# Patient Record
Sex: Female | Born: 1940 | Race: Black or African American | Hispanic: No | Smoking: Never smoker
Health system: Southern US, Community
[De-identification: ages and names within clinical notes are randomized; demographics above are authoritative.]

## PROBLEM LIST (undated history)

## (undated) DIAGNOSIS — I10 Essential (primary) hypertension: Secondary | ICD-10-CM

## (undated) DIAGNOSIS — E785 Hyperlipidemia, unspecified: Secondary | ICD-10-CM

## (undated) HISTORY — DX: Essential (primary) hypertension: I10

## (undated) HISTORY — DX: Hyperlipidemia, unspecified: E78.5

---

## 2000-03-23 ENCOUNTER — Emergency Department (HOSPITAL_COMMUNITY): Admission: EM | Admit: 2000-03-23 | Discharge: 2000-03-23 | Payer: Self-pay | Admitting: *Deleted

## 2000-04-30 ENCOUNTER — Emergency Department (HOSPITAL_COMMUNITY): Admission: EM | Admit: 2000-04-30 | Discharge: 2000-04-30 | Payer: Self-pay | Admitting: Emergency Medicine

## 2000-09-22 ENCOUNTER — Encounter: Admission: RE | Admit: 2000-09-22 | Discharge: 2000-09-22 | Payer: Self-pay | Admitting: Sports Medicine

## 2000-10-01 ENCOUNTER — Encounter: Admission: RE | Admit: 2000-10-01 | Discharge: 2000-10-01 | Payer: Self-pay | Admitting: Pediatrics

## 2000-11-13 ENCOUNTER — Encounter: Admission: RE | Admit: 2000-11-13 | Discharge: 2000-11-13 | Payer: Self-pay | Admitting: Family Medicine

## 2000-11-28 ENCOUNTER — Encounter: Admission: RE | Admit: 2000-11-28 | Discharge: 2000-11-28 | Payer: Self-pay | Admitting: Family Medicine

## 2001-01-14 ENCOUNTER — Encounter: Admission: RE | Admit: 2001-01-14 | Discharge: 2001-01-14 | Payer: Self-pay | Admitting: Family Medicine

## 2001-04-27 ENCOUNTER — Encounter: Admission: RE | Admit: 2001-04-27 | Discharge: 2001-04-27 | Payer: Self-pay | Admitting: Family Medicine

## 2001-06-19 ENCOUNTER — Encounter: Admission: RE | Admit: 2001-06-19 | Discharge: 2001-06-19 | Payer: Self-pay | Admitting: Family Medicine

## 2001-06-29 ENCOUNTER — Encounter: Admission: RE | Admit: 2001-06-29 | Discharge: 2001-06-29 | Payer: Self-pay | Admitting: Family Medicine

## 2001-07-08 ENCOUNTER — Encounter: Admission: RE | Admit: 2001-07-08 | Discharge: 2001-07-08 | Payer: Self-pay | Admitting: Family Medicine

## 2001-09-01 ENCOUNTER — Encounter: Admission: RE | Admit: 2001-09-01 | Discharge: 2001-09-01 | Payer: Self-pay | Admitting: Family Medicine

## 2001-09-01 ENCOUNTER — Encounter: Payer: Self-pay | Admitting: *Deleted

## 2001-09-01 ENCOUNTER — Encounter: Admission: RE | Admit: 2001-09-01 | Discharge: 2001-09-01 | Payer: Self-pay | Admitting: *Deleted

## 2001-10-14 ENCOUNTER — Encounter: Admission: RE | Admit: 2001-10-14 | Discharge: 2001-10-14 | Payer: Self-pay | Admitting: Family Medicine

## 2003-05-17 ENCOUNTER — Encounter (INDEPENDENT_AMBULATORY_CARE_PROVIDER_SITE_OTHER): Payer: Self-pay | Admitting: *Deleted

## 2003-05-20 ENCOUNTER — Encounter: Admission: RE | Admit: 2003-05-20 | Discharge: 2003-05-20 | Payer: Self-pay | Admitting: Family Medicine

## 2003-12-16 ENCOUNTER — Ambulatory Visit: Payer: Self-pay | Admitting: Family Medicine

## 2004-05-02 ENCOUNTER — Ambulatory Visit: Payer: Self-pay | Admitting: Sports Medicine

## 2004-05-02 ENCOUNTER — Encounter: Admission: RE | Admit: 2004-05-02 | Discharge: 2004-05-02 | Payer: Self-pay | Admitting: Sports Medicine

## 2004-05-17 ENCOUNTER — Encounter: Admission: RE | Admit: 2004-05-17 | Discharge: 2004-05-17 | Payer: Self-pay | Admitting: Sports Medicine

## 2004-08-03 ENCOUNTER — Emergency Department (HOSPITAL_COMMUNITY): Admission: EM | Admit: 2004-08-03 | Discharge: 2004-08-03 | Payer: Self-pay | Admitting: Emergency Medicine

## 2004-10-26 ENCOUNTER — Ambulatory Visit: Payer: Self-pay | Admitting: Family Medicine

## 2006-02-24 ENCOUNTER — Ambulatory Visit: Payer: Self-pay | Admitting: Family Medicine

## 2006-03-14 ENCOUNTER — Ambulatory Visit: Payer: Self-pay | Admitting: Internal Medicine

## 2006-03-14 ENCOUNTER — Ambulatory Visit (HOSPITAL_COMMUNITY): Admission: RE | Admit: 2006-03-14 | Discharge: 2006-03-14 | Payer: Self-pay | Admitting: Sports Medicine

## 2006-04-27 ENCOUNTER — Emergency Department (HOSPITAL_COMMUNITY): Admission: EM | Admit: 2006-04-27 | Discharge: 2006-04-27 | Payer: Self-pay | Admitting: Emergency Medicine

## 2006-05-15 DIAGNOSIS — E7800 Pure hypercholesterolemia, unspecified: Secondary | ICD-10-CM

## 2006-05-15 DIAGNOSIS — R358 Other polyuria: Secondary | ICD-10-CM

## 2006-05-15 DIAGNOSIS — I10 Essential (primary) hypertension: Secondary | ICD-10-CM

## 2006-05-16 ENCOUNTER — Encounter (INDEPENDENT_AMBULATORY_CARE_PROVIDER_SITE_OTHER): Payer: Self-pay | Admitting: *Deleted

## 2006-07-18 ENCOUNTER — Ambulatory Visit: Payer: Self-pay | Admitting: Family Medicine

## 2006-11-18 ENCOUNTER — Telehealth (INDEPENDENT_AMBULATORY_CARE_PROVIDER_SITE_OTHER): Payer: Self-pay | Admitting: *Deleted

## 2006-11-19 ENCOUNTER — Ambulatory Visit: Payer: Self-pay | Admitting: Family Medicine

## 2006-11-19 DIAGNOSIS — M25569 Pain in unspecified knee: Secondary | ICD-10-CM | POA: Insufficient documentation

## 2006-11-19 DIAGNOSIS — S6000XA Contusion of unspecified finger without damage to nail, initial encounter: Secondary | ICD-10-CM

## 2006-11-21 ENCOUNTER — Encounter: Admission: RE | Admit: 2006-11-21 | Discharge: 2006-11-21 | Payer: Self-pay | Admitting: *Deleted

## 2006-11-21 ENCOUNTER — Encounter (INDEPENDENT_AMBULATORY_CARE_PROVIDER_SITE_OTHER): Payer: Self-pay | Admitting: *Deleted

## 2006-11-24 ENCOUNTER — Telehealth (INDEPENDENT_AMBULATORY_CARE_PROVIDER_SITE_OTHER): Payer: Self-pay | Admitting: *Deleted

## 2006-12-05 ENCOUNTER — Encounter: Payer: Self-pay | Admitting: Family Medicine

## 2007-02-26 ENCOUNTER — Telehealth: Payer: Self-pay | Admitting: *Deleted

## 2007-02-27 ENCOUNTER — Encounter: Payer: Self-pay | Admitting: Family Medicine

## 2007-03-27 ENCOUNTER — Encounter: Payer: Self-pay | Admitting: Family Medicine

## 2007-03-27 ENCOUNTER — Ambulatory Visit: Payer: Self-pay | Admitting: Family Medicine

## 2007-03-27 LAB — CONVERTED CEMR LAB
AST: 18 units/L (ref 0–37)
Albumin: 4.6 g/dL (ref 3.5–5.2)
BUN: 17 mg/dL (ref 6–23)
Calcium: 9.9 mg/dL (ref 8.4–10.5)
Chloride: 102 meq/L (ref 96–112)
Glucose, Bld: 100 mg/dL — ABNORMAL HIGH (ref 70–99)
LDL Cholesterol: 141 mg/dL — ABNORMAL HIGH (ref 0–99)
Potassium: 4.2 meq/L (ref 3.5–5.3)
Total Bilirubin: 0.5 mg/dL (ref 0.3–1.2)
Total CHOL/HDL Ratio: 3.6
VLDL: 19 mg/dL (ref 0–40)

## 2007-12-23 ENCOUNTER — Telehealth: Payer: Self-pay | Admitting: Family Medicine

## 2008-01-04 ENCOUNTER — Encounter: Payer: Self-pay | Admitting: Family Medicine

## 2008-01-04 ENCOUNTER — Ambulatory Visit: Payer: Self-pay | Admitting: Family Medicine

## 2008-01-07 ENCOUNTER — Encounter: Payer: Self-pay | Admitting: Family Medicine

## 2008-03-03 IMAGING — CR DG CHEST 2V
2 series · 2 of 2 positions shown · non-contrast
Comparison: There are no prior studies available for comparison.

CLINICAL DATA: Cough, congestion and chills.
 CHEST - 2 VIEW:

[view not recorded (1 of 2)]
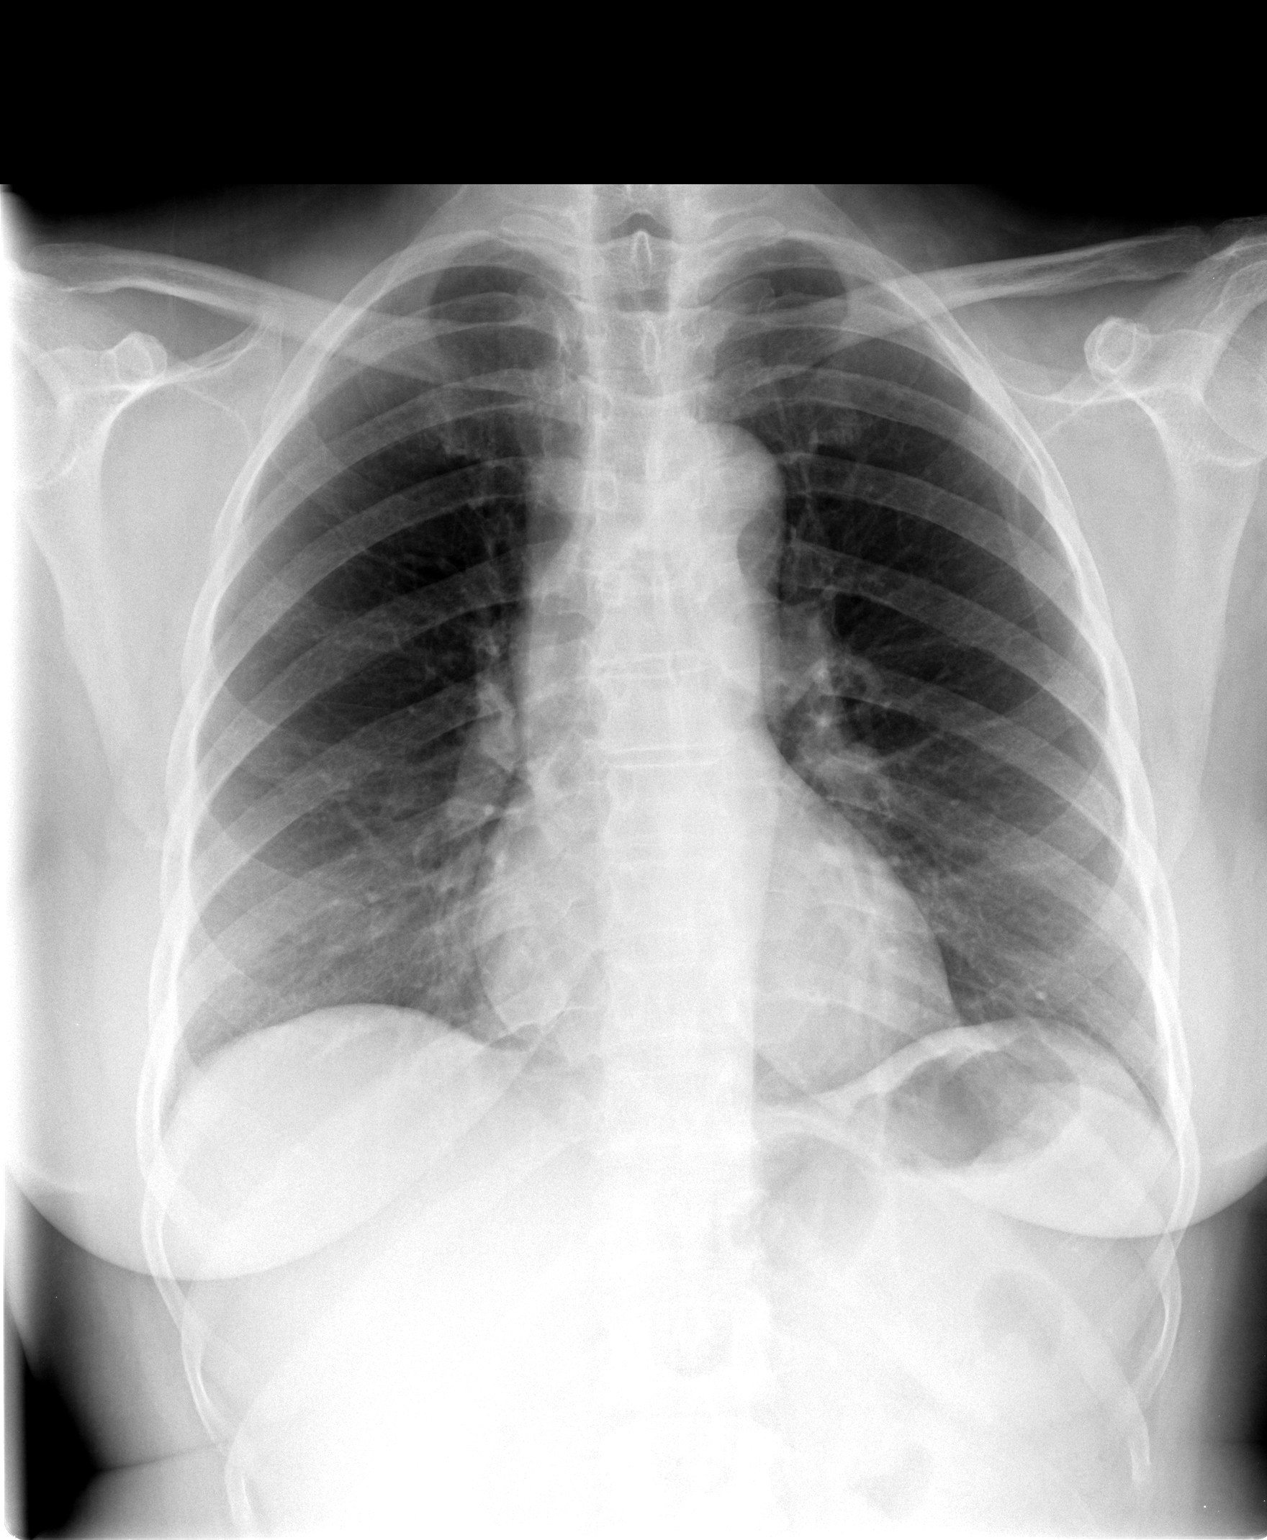

[view not recorded (2 of 2)]
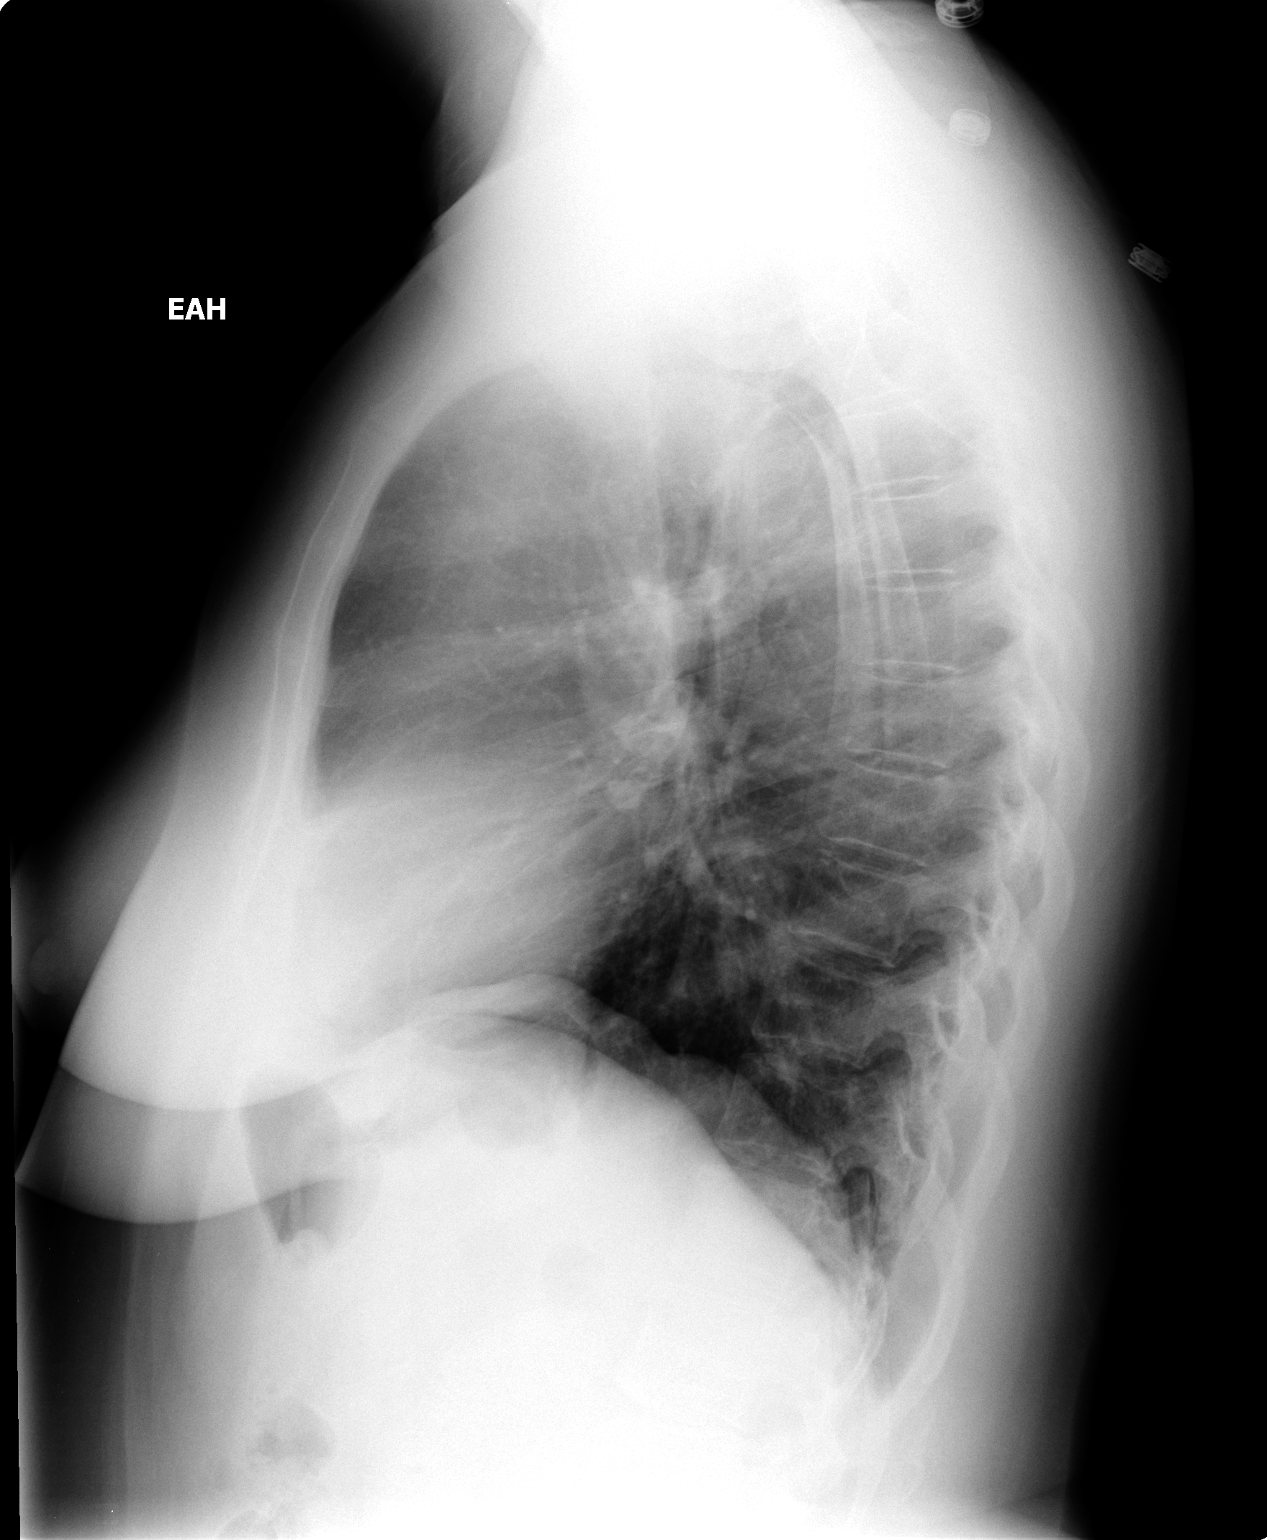

[2 of 2 positions shown; findings below may reference images not displayed]

FINDINGS: The heart size and mediastinal contours are within normal limits.  Both lungs are clear.  The visualized skeletal structures are unremarkable.
IMPRESSION: No active cardiopulmonary disease.

## 2008-09-27 IMAGING — CR DG KNEE 3 VIEWS*R*
3 series · 3 of 3 positions shown · non-contrast
Comparison: none

CLINICAL DATA: Bilateral knee pain.
 RIGHT KNEE ? THREE VIEWS:
 No sign of effusion.  There is evidence of osteoarthritis of a mild degree in the medial compartment and patellofemoral joint.  No focal lesion elsewhere.

[view not recorded (1 of 3)]
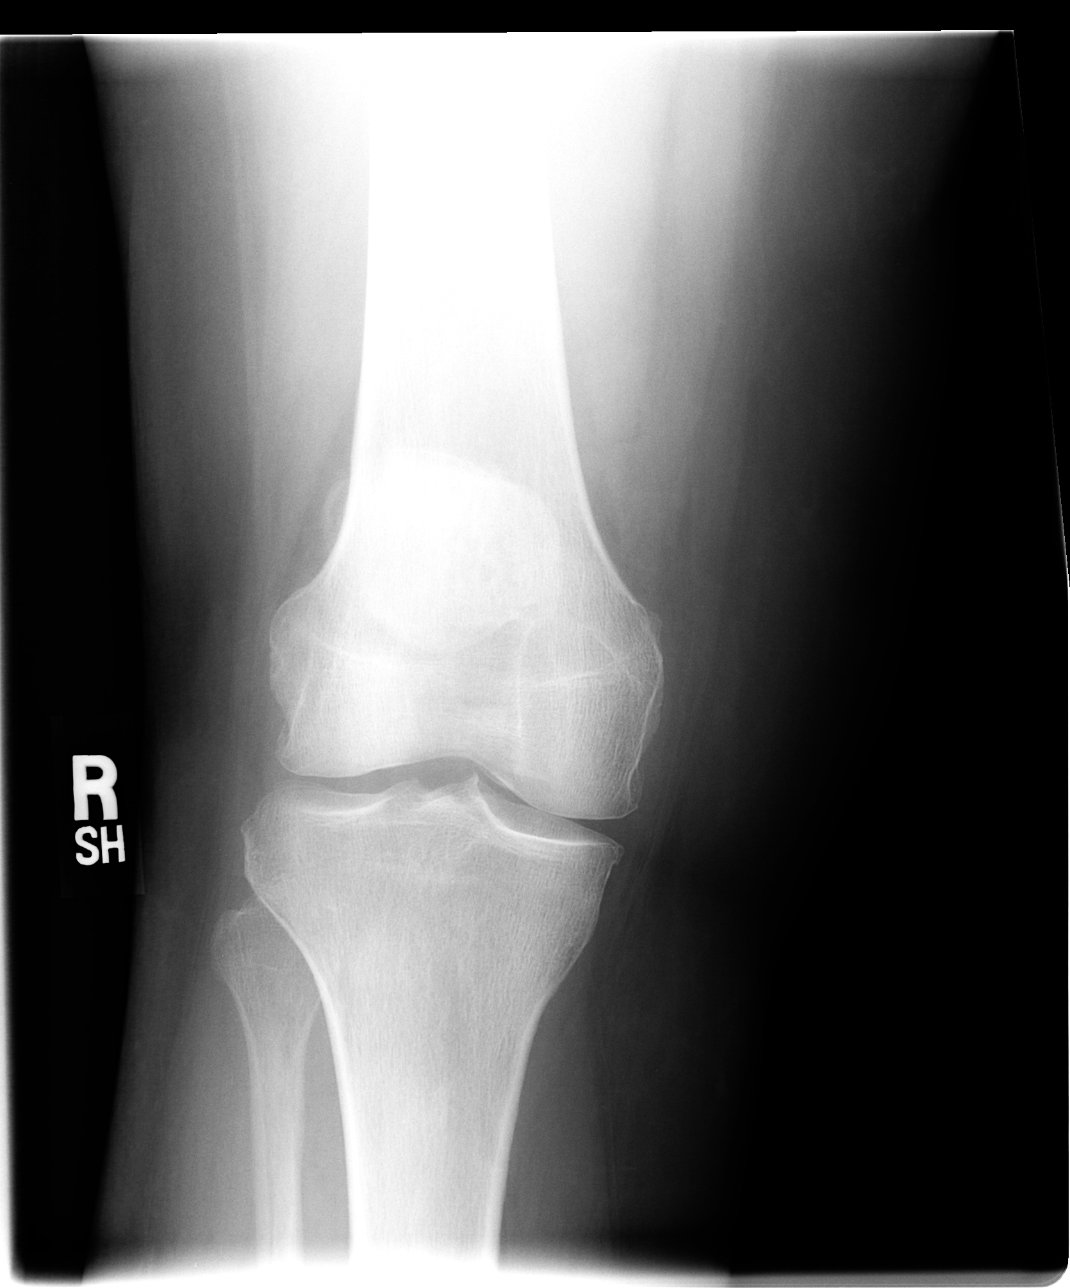

[view not recorded (2 of 3)]
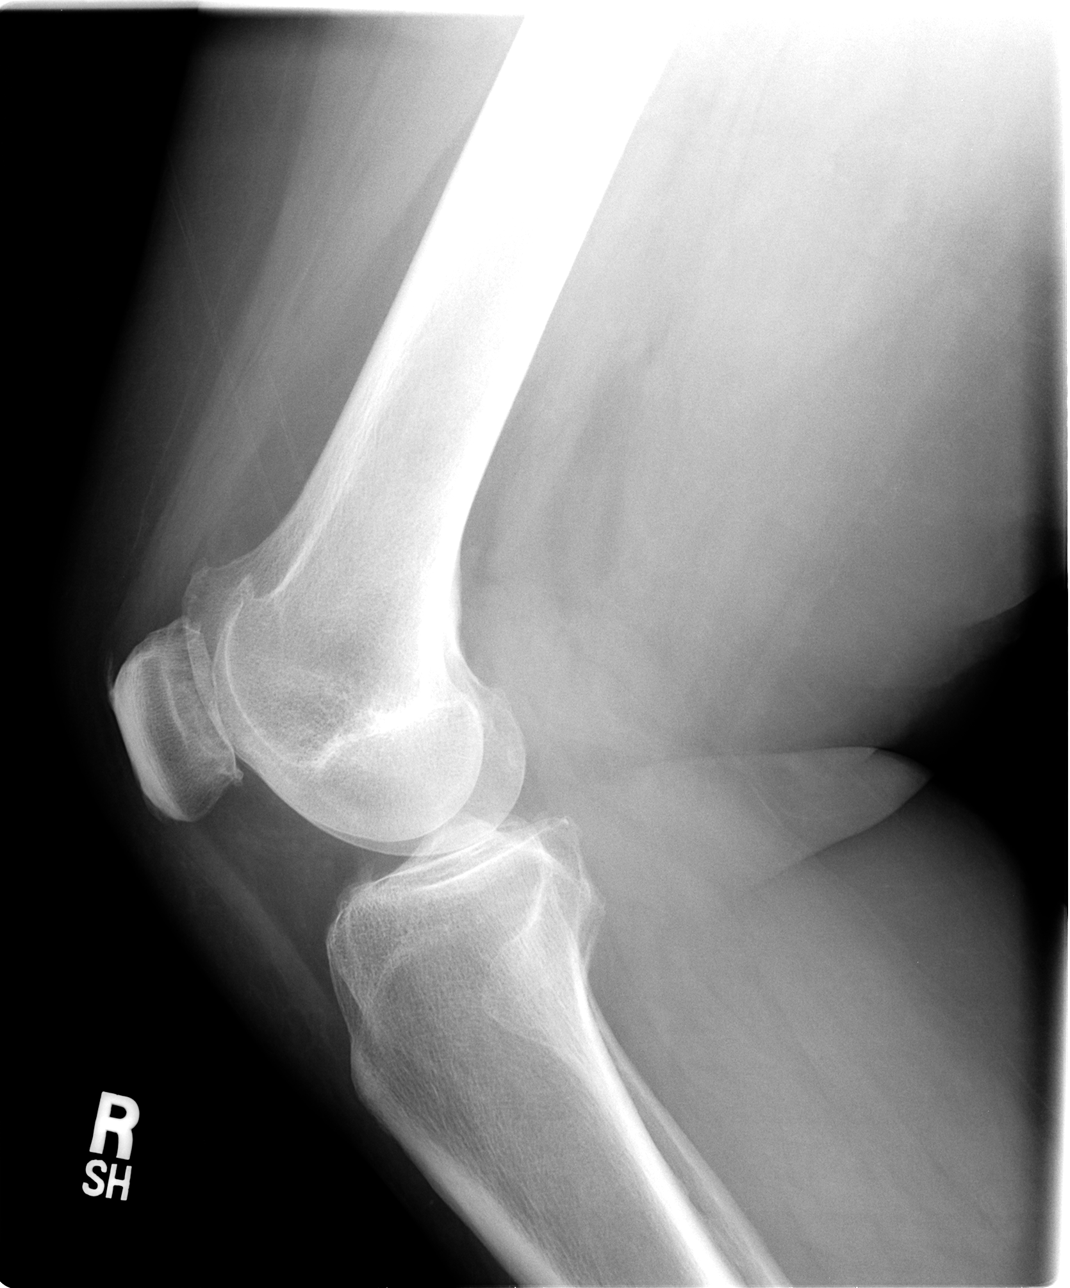

[view not recorded (3 of 3)]
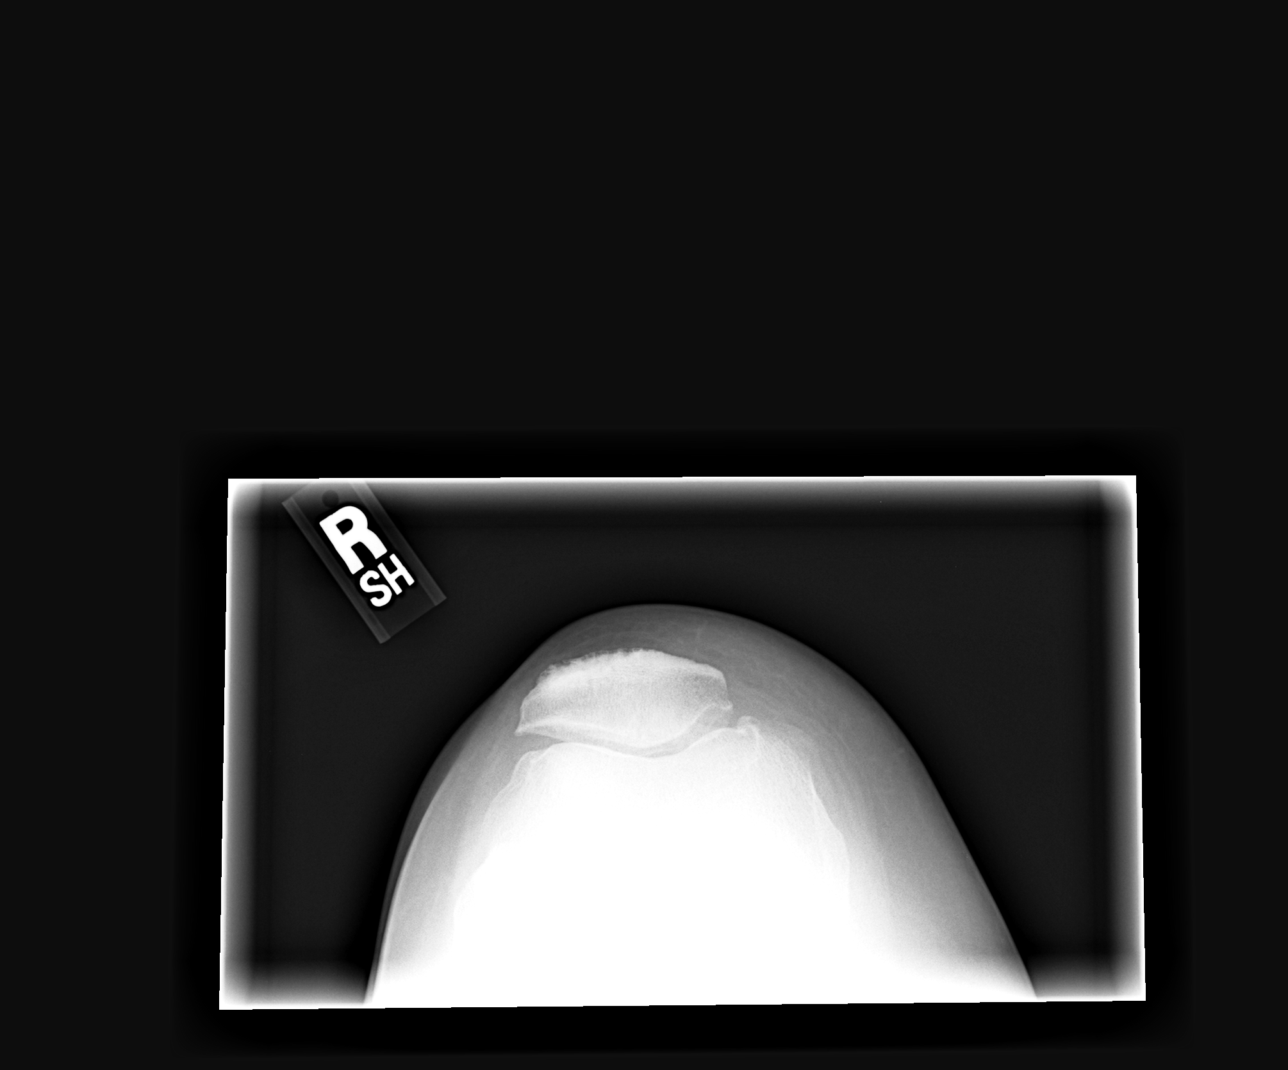

[3 of 3 positions shown; findings below may reference images not displayed]

IMPRESSION: Medial compartment and patellofemoral joint osteoarthritis.
 LEFT KNEE ? THREE VIEWS:
 Similarly on this side, there is evidence of medial compartment and patellofemoral osteoarthritis but no pronounced disease.  No focal lesions.
IMPRESSION: Medial compartment and patellofemoral joint osteoarthritic change.

## 2008-09-29 ENCOUNTER — Ambulatory Visit: Payer: Self-pay | Admitting: Family Medicine

## 2008-10-18 ENCOUNTER — Telehealth: Payer: Self-pay | Admitting: *Deleted

## 2008-11-29 ENCOUNTER — Ambulatory Visit: Payer: Self-pay | Admitting: Family Medicine

## 2008-11-30 LAB — CONVERTED CEMR LAB
ALT: 18 units/L (ref 0–35)
AST: 20 units/L (ref 0–37)
Alkaline Phosphatase: 61 units/L (ref 39–117)
BUN: 10 mg/dL (ref 6–23)
CO2: 31 meq/L (ref 19–32)
Cholesterol: 218 mg/dL — ABNORMAL HIGH (ref 0–200)
Creatinine, Ser: 0.8 mg/dL (ref 0.4–1.2)
GFR calc non Af Amer: 91.76 mL/min (ref >60)
Glucose, Bld: 92 mg/dL (ref 70–99)
HDL: 48 mg/dL (ref >39.00)
Sodium: 142 meq/L (ref 135–145)
Total Protein: 7.6 g/dL (ref 6.0–8.3)
Triglycerides: 95 mg/dL (ref 0.0–149.0)

## 2008-12-02 ENCOUNTER — Ambulatory Visit (HOSPITAL_COMMUNITY): Admission: RE | Admit: 2008-12-02 | Discharge: 2008-12-02 | Payer: Self-pay | Admitting: Family Medicine

## 2009-05-15 ENCOUNTER — Telehealth: Payer: Self-pay | Admitting: Family Medicine

## 2009-05-22 ENCOUNTER — Telehealth: Payer: Self-pay | Admitting: Family Medicine

## 2009-06-01 ENCOUNTER — Encounter (INDEPENDENT_AMBULATORY_CARE_PROVIDER_SITE_OTHER): Payer: Self-pay | Admitting: *Deleted

## 2009-06-01 ENCOUNTER — Ambulatory Visit: Payer: Self-pay | Admitting: Family Medicine

## 2009-06-08 ENCOUNTER — Ambulatory Visit: Payer: Self-pay | Admitting: Family Medicine

## 2009-06-08 LAB — CONVERTED CEMR LAB
ALT: 19 units/L (ref 0–35)
Alkaline Phosphatase: 61 units/L (ref 39–117)
Bilirubin, Direct: 0.1 mg/dL (ref 0.0–0.3)
HDL: 60.1 mg/dL (ref >39.00)
Total CHOL/HDL Ratio: 3
Total Protein: 7.8 g/dL (ref 6.0–8.3)
Triglycerides: 100 mg/dL (ref 0.0–149.0)

## 2009-06-19 ENCOUNTER — Encounter (INDEPENDENT_AMBULATORY_CARE_PROVIDER_SITE_OTHER): Payer: Self-pay | Admitting: *Deleted

## 2009-06-20 ENCOUNTER — Ambulatory Visit: Payer: Self-pay | Admitting: Internal Medicine

## 2009-12-08 ENCOUNTER — Ambulatory Visit: Payer: Self-pay | Admitting: Family Medicine

## 2009-12-11 LAB — CONVERTED CEMR LAB
ALT: 15 units/L (ref 0–35)
AST: 20 units/L (ref 0–37)
Cholesterol: 256 mg/dL — ABNORMAL HIGH (ref 0–200)
Direct LDL: 183.8 mg/dL
HDL: 47.7 mg/dL (ref >39.00)
Total CHOL/HDL Ratio: 5
Triglycerides: 135 mg/dL (ref 0.0–149.0)
VLDL: 27 mg/dL (ref 0.0–40.0)

## 2010-02-13 ENCOUNTER — Ambulatory Visit: Payer: Self-pay | Admitting: Family Medicine

## 2010-02-19 LAB — CONVERTED CEMR LAB
ALT: 20 units/L (ref 0–35)
AST: 23 units/L (ref 0–37)
Cholesterol: 220 mg/dL — ABNORMAL HIGH (ref 0–200)
HDL: 60 mg/dL (ref >39.00)
Total Bilirubin: 0.5 mg/dL (ref 0.3–1.2)
Total CHOL/HDL Ratio: 4
Total Protein: 7.1 g/dL (ref 6.0–8.3)
Triglycerides: 111 mg/dL (ref 0.0–149.0)
VLDL: 22.2 mg/dL (ref 0.0–40.0)

## 2010-04-08 ENCOUNTER — Encounter: Payer: Self-pay | Admitting: Sports Medicine

## 2010-04-17 NOTE — Progress Notes (Signed)
Summary: Referral appt for Colon Procedure--cancelled  Phone Note Other Incoming   Summary of Call: Pt cancelled referral appt for Colon Procedure w/ Dr. Christella Hartigan, will call back to resch.Marland KitchenMarland KitchenDaine Gip  May 15, 2009 3:28 PM Initial call taken by: Daine Gip,  May 15, 2009 3:28 PM

## 2010-04-17 NOTE — Assessment & Plan Note (Signed)
Summary: BLOOD PRESSURE MEDICATION   Vital Signs:  Patient profile:   70 year old female Height:      69 inches Weight:      211.38 pounds Temp:     97.8 degrees F oral Pulse rate:   80 / minute Pulse rhythm:   regular BP sitting:   122 / 78  (left arm) Cuff size:   large  Vitals Entered By: Delilah Shan CMA Duncan Dull) (June 01, 2009 3:49 PM)  Serial Vital Signs/Assessments:  Time      Position  BP       Pulse  Resp  Temp     By 3:50 PM             116/70                         Lugene Fuquay CMA (AAMA)           R Arm     116/70                         Lugene Fuquay CMA (AAMA)   History of Present Illness: Ms. Forness is a very pleasant 71 year old female with h/o HTN, OA,  and HLD here for follow up HTN, HLD.   1. Hyperlipidemia-stopped taking Zocor secondary to myalgias several months ago. Has been on Zocor for last six months, no myalgias.  Also exercising and feels her diet is much better.   2.  HTN- controlled.  Has been compliant with meds, Aldactazide 25-25 and Amlodipine 5 mg,  and is walking daily.  No CP.HA or visual changes.    Current Medications (verified): 1)  Amlodipine Besylate 5 Mg  Tabs (Amlodipine Besylate) .Marland Kitchen.. 1 Once Daily 2)  Aldactazide 25-25 Mg  Tabs (Spironolactone-Hctz) .Marland Kitchen.. 1 Daily 3)  Pravachol 40 Mg Tabs (Pravastatin Sodium) .... Take 1 Tab By Mouth At Bedtime 4)  Aspirin 81 Mg Chew (Aspirin) .Marland Kitchen.. 1 Tab By Mouth Daily. 5)  Multivitamins  Tabs (Multiple Vitamin) .Marland Kitchen.. 1 Tab By Mouth Daily.  Allergies: 1)  ! Penicillin  Review of Systems      See HPI General:  Denies malaise. CV:  Denies chest pain or discomfort. Resp:  Denies shortness of breath. GI:  Denies abdominal pain. Derm:  Denies rash. Neuro:  Denies headaches and visual disturbances. Psych:  Denies anxiety and depression.  Physical Exam  General:  alert, well-developed, and well-nourished, pleasant.   Eyes:  No corneal or conjunctival inflammation noted. EOMI. Perrla.  Funduscopic exam benign, without hemorrhages, exudates or papilledema. Vision grossly normal. Mouth:  MMM Lungs:  Normal respiratory effort, chest expands symmetrically. Lungs are clear to auscultation, no crackles or wheezes. Heart:  Normal rate and regular rhythm. S1 and S2 normal without gallop, murmur, click, rub or other extra sounds. Psych:  Cognition and judgment appear intact. Alert and cooperative with normal attention span and concentration. No apparent delusions, illusions, hallucinations   Complete Medication List: 1)  Amlodipine Besylate 5 Mg Tabs (Amlodipine besylate) .Marland Kitchen.. 1 once daily 2)  Aldactazide 25-25 Mg Tabs (Spironolactone-hctz) .Marland Kitchen.. 1 daily 3)  Pravachol 40 Mg Tabs (Pravastatin sodium) .... Take 1 tab by mouth at bedtime 4)  Aspirin 81 Mg Chew (Aspirin) .Marland Kitchen.. 1 tab by mouth daily. 5)  Multivitamins Tabs (Multiple vitamin) .Marland Kitchen.. 1 tab by mouth daily.  Patient Instructions: 1)  Nice to see you, Ms. Britz. 2)  You look great!  3)  Please make a fasting lab appointment up from for a lipid panel and hepatic panel ( ICD 272.4). Prescriptions: ALDACTAZIDE 25-25 MG  TABS (SPIRONOLACTONE-HCTZ) 1 daily  #30 x 6   Entered and Authorized by:   Ruthe Mannan MD   Signed by:   Ruthe Mannan MD on 06/01/2009   Method used:   [facility]        [facility] (retail)       [address]       [place], [place]  [address]       Ph: [phone] or [phone]       Fax: [phone]   RxID:   [id number] AMLODIPINE BESYLATE 5 MG  TABS (AMLODIPINE BESYLATE) 1 once daily  #30 Tablet x 6   Entered and Authorized by:   Ruthe Mannan MD   Signed by:   Ruthe Mannan MD on 06/01/2009   Method used:   [facility]        [facility] (retail)       [address]       [place], [place]  [address]       Ph: [phone] or [phone]       Fax: [phone]   RxID:   [id number]   Current Allergies (reviewed today): ! PENICILLIN

## 2010-04-17 NOTE — Progress Notes (Signed)
Summary: ALDACTAZIDE/ AMLODIPINE BESYLATE  Phone Note Refill Request Message from:  Patient on May 22, 2009 2:24 PM  Refills Requested: Medication #1:  AMLODIPINE BESYLATE 5 MG  TABS 1 once daily  Medication #2:  ALDACTAZIDE 25-25 MG  TABS 1 daily pt walked in wanting refills, I advised pt I would refill x74mth and pt needs to schedule f/u with Dr. Dayton Martes. Pt scheduled appt with Dr. Dayton Martes on 05/26/2009   Method Requested: Electronic Initial call taken by: Mervin Hack CMA (AAMA),  May 22, 2009 2:25 PM    Prescriptions: ALDACTAZIDE 25-25 MG  TABS (SPIRONOLACTONE-HCTZ) 1 daily  #30 x 0   Entered by:   Mervin Hack CMA (AAMA)   Authorized by:   Ruthe Mannan MD   Signed by:   Mervin Hack CMA (AAMA) on 05/22/2009   Method used:   [facility]        [facility] (retail)       [address]       [place], [place]  [address]       Ph: [phone] or [phone]       Fax: [phone]   RxID:   [id number] AMLODIPINE BESYLATE 5 MG  TABS (AMLODIPINE BESYLATE) 1 once daily  #30 Tablet x 0   Entered by:   Mervin Hack CMA (AAMA)   Authorized by:   Ruthe Mannan MD   Signed by:   Mervin Hack CMA (AAMA) on 05/22/2009   Method used:   [facility] to        [facility] (retail)       [address]       [place], [place]  [address]       Ph: [phone] or [phone]       Fax: [phone]   RxID:   [id number]

## 2010-04-17 NOTE — Letter (Signed)
Summary: Previsit letter  [facility]  [address] [place], [place] [address]   Phone: [phone]  Fax: [phone]       06/01/2009 MRN: [id number]  Ebony Knox [address] [place], [place]  [address]  Dear Ebony Knox,  Welcome to the Gastroenterology Division at [facility].    You are scheduled to see a nurse for your pre-procedure visit on 06/20/2009 at 4:00PM on the 3rd floor at [facility], [address]  We ask that you try to arrive at our office 15 minutes prior to your appointment time to allow for check-in.  Your nurse visit will consist of discussing your medical and surgical history, your immediate family medical history, and your medications.    Please bring a complete list of all your medications or, if you prefer, bring the medication bottles and we will list them.  We will need to be aware of both prescribed and over the counter drugs.  We will need to know exact dosage information as well.  If you are on blood thinners (Coumadin, Plavix, Aggrenox, Ticlid, etc.) please call our office today/prior to your appointment, as we need to consult with your physician about holding your medication.   Please be prepared to read and sign documents such as consent forms, a financial agreement, and acknowledgement forms.  If necessary, and with your consent, a friend or relative is welcome to sit-in on the nurse visit with you.  Please bring your insurance card so that we may make a copy of it.  If your insurance requires a referral to see a specialist, please bring your referral form from your primary care physician.  No co-pay is required for this nurse visit.     If you cannot keep your appointment, please call [phone] to cancel or reschedule prior to your appointment date.  This allows [place] the opportunity to schedule an appointment for another patient in need of care.    Thank you for choosing [facility] for your medical  needs.  We appreciate the opportunity to care for you.  Please visit [place] at our website  to learn more about our practice.                     Sincerely.                                                                                                                   The Gastroenterology Division

## 2010-04-17 NOTE — Letter (Signed)
Summary: [facility] Instructions  [facility]  [address] [place], [place] [address]   Phone: [phone]  Fax: [phone]       CARRIN VANNOSTRAND    01/06/1941    MRN: [id number]       Procedure Day /Date:  07/13/09  Thursday     Arrival Time:  8:30am     Procedure Time: 9:30am     Location of Procedure:                    x   [facility] (4th Floor)   PREPARATION FOR COLONOSCOPY WITH MIRALAX  Starting 5 days prior to your procedure 07/08/09 do not eat nuts, seeds, popcorn, corn, beans, peas,  salads, or any raw vegetables.  Do not take any fiber supplements (e.g. Metamucil, Citrucel, and Benefiber). ____________________________________________________________________________________________________   THE DAY BEFORE YOUR PROCEDURE         DATE:   07/12/09 DAY:  Wednesday  1   Drink clear liquids the entire day-NO SOLID FOOD  2   Do not drink anything colored red or purple.  Avoid juices with pulp.  No orange juice.  3   Drink at least 64 oz. (8 glasses) of fluid/clear liquids during the day to prevent dehydration and help the prep work efficiently.  CLEAR LIQUIDS INCLUDE: Water Jello Ice Popsicles Tea (sugar ok, no milk/cream) Powdered fruit flavored drinks Coffee (sugar ok, no milk/cream) Gatorade Juice: apple, white grape, white cranberry  Lemonade Clear bullion, consomm, broth Carbonated beverages (any kind) Strained chicken noodle soup Hard Candy  4   Mix the entire bottle of Miralax with 64 oz. of Gatorade/Powerade in the morning and put in the refrigerator to chill.  5   At 3:00 pm take 2 Dulcolax/Bisacodyl tablets.  6   At 4:30 pm take one Reglan/Metoclopramide tablet.  7  Starting at 5:00 pm drink one 8 oz glass of the Miralax mixture every 15-20 minutes until you have finished drinking the entire 64 oz.  You should finish drinking prep around 7:30 or 8:00 pm.  8   If you are nauseated, you may take the 2nd Reglan/Metoclopramide  tablet at 6:30 pm.        9    At 8:00 pm take 2 more DULCOLAX/Bisacodyl tablets.     THE DAY OF YOUR PROCEDURE      DATE:   07/13/09  DAY:  Thursday  You may drink clear liquids until  7:30 am  (2 HOURS BEFORE PROCEDURE).   MEDICATION INSTRUCTIONS  Unless otherwise instructed, you should take regular prescription medications with a small sip of water as early as possible the morning of your procedure.          OTHER INSTRUCTIONS  You will need a responsible adult at least 70 years of age to accompany you and drive you home.   This person must remain in the waiting room during your procedure.  Wear loose fitting clothing that is easily removed.  Leave jewelry and other valuables at home.  However, you may wish to bring a book to read or an iPod/MP3 player to listen to music as you wait for your procedure to start.  Remove all body piercing jewelry and leave at home.  Total time from sign-in until discharge is approximately 2-3 hours.  You should go home directly after your procedure and rest.  You can resume normal activities the day after your procedure.  The day of your procedure you  should not:   Drive   Make legal decisions   Operate machinery   Drink alcohol   Return to work  You will receive specific instructions about eating, activities and medications before you leave.   The above instructions have been reviewed and explained to me by   _______________________    I fully understand and can verbalize these instructions _____________________________ Date _______

## 2010-04-17 NOTE — Miscellaneous (Signed)
Summary: DIR COL...AS.  Clinical Lists Changes  Medications: Added new medication of MIRALAX   POWD (POLYETHYLENE GLYCOL 3350) As per prep  instructions. - Signed Added new medication of DULCOLAX 5 MG  TBEC (BISACODYL) Day before procedure take 2 at 3pm and 2 at 8pm. - Signed Added new medication of REGLAN 10 MG  TABS (METOCLOPRAMIDE HCL) As per prep instructions. - Signed Rx of MIRALAX   POWD (POLYETHYLENE GLYCOL 3350) As per prep  instructions.;  #255gm x 0;  Signed;  Entered by: Harlow Mares CMA (AAMA);  Authorized by: Hart Carwin MD;  Method used: [facility] [address], [place], [place]  [address], Ph: [phone] or [phone], Fax: [phone] Rx of DULCOLAX 5 MG  TBEC (BISACODYL) Day before procedure take 2 at 3pm and 2 at 8pm.;  #4 x 0;  Signed;  Entered by: Harlow Mares CMA (AAMA);  Authorized by: Hart Carwin MD;  Method used: [facility], [place], [place]  [address], Ph: [phone] or [phone], Fax: [phone] Rx of REGLAN 10 MG  TABS (METOCLOPRAMIDE HCL) As per prep instructions.;  #2 x 0;  Signed;  Entered by: Harlow Mares CMA (AAMA);  Authorized by: Hart Carwin MD;  Method used: [facility], [place], [place]  [address], Ph: [phone] or [phone], Fax: [phone]    Prescriptions: REGLAN 10 MG  TABS (METOCLOPRAMIDE HCL) As per prep instructions.  #2 x 0   Entered by:   Harlow Mares CMA (AAMA)   Authorized by:   Hart Carwin MD   Signed by:   Harlow Mares CMA (AAMA) on 06/20/2009   Method used:   [facility]ronically to        [facility] (retail)       [address]       [place], [place]  [address]       Ph: [phone] or [phone]       Fax: [phone]   RxID:   [id number] DULCOLAX 5 MG  TBEC (BISACODYL) Day before procedure take 2 at 3pm and 2 at 8pm.  #4 x 0   Entered by:   Harlow Mares  CMA (AAMA)   Authorized by:   Hart Carwin MD   Signed by:   Harlow Mares CMA (AAMA) on 06/20/2009   Method used:   [facility]ron[facility] to        [facility] (retail)       [address]       [place], [place]  [address]       Ph: [phone] or [phone]       Fax: [phone]   RxID:   [id number] MIRALAX   POWD (POLYETHYLENE GLYCOL 3350) As per prep  instructions.  #255gm x 0   Entered by:   Harlow Mares CMA (AAMA)   Authorized by:   Hart Carwin MD   Signed by:   Harlow Mares CMA (AAMA) on 06/20/2009   Method used:   [facility]ron[facility] to        [facility] (retail)       [address]       [place], [place]  [address]       Ph: [phone] or [phone]       Fax: [phone]   RxID:   [id number]

## 2010-05-29 ENCOUNTER — Encounter: Payer: Self-pay | Admitting: Family Medicine

## 2010-05-29 LAB — HM SIGMOIDOSCOPY

## 2010-05-29 LAB — HM COLONOSCOPY

## 2010-06-04 ENCOUNTER — Encounter: Payer: Self-pay | Admitting: Family Medicine

## 2010-06-04 ENCOUNTER — Other Ambulatory Visit: Payer: Self-pay | Admitting: Family Medicine

## 2010-06-04 ENCOUNTER — Ambulatory Visit (INDEPENDENT_AMBULATORY_CARE_PROVIDER_SITE_OTHER): Payer: Medicare Other | Admitting: Family Medicine

## 2010-06-04 DIAGNOSIS — I10 Essential (primary) hypertension: Secondary | ICD-10-CM

## 2010-06-04 DIAGNOSIS — E785 Hyperlipidemia, unspecified: Secondary | ICD-10-CM

## 2010-06-04 DIAGNOSIS — M949 Disorder of cartilage, unspecified: Secondary | ICD-10-CM

## 2010-06-04 LAB — LIPID PANEL
HDL: 61.6 mg/dL (ref >39.00)
Total CHOL/HDL Ratio: 4
Triglycerides: 78 mg/dL (ref 0.0–149.0)
VLDL: 15.6 mg/dL (ref 0.0–40.0)

## 2010-06-04 LAB — BASIC METABOLIC PANEL WITH GFR
BUN: 17 mg/dL (ref 6–23)
CO2: 30 meq/L (ref 19–32)
Calcium: 10.3 mg/dL (ref 8.4–10.5)
Chloride: 99 meq/L (ref 96–112)
Creatinine, Ser: 0.9 mg/dL (ref 0.4–1.2)
GFR: 77.74 mL/min (ref >60.00)
Glucose, Bld: 91 mg/dL (ref 70–99)
Potassium: 4.1 meq/L (ref 3.5–5.1)
Sodium: 137 meq/L (ref 135–145)

## 2010-06-04 LAB — LDL CHOLESTEROL, DIRECT: Direct LDL: 151.7 mg/dL

## 2010-06-05 ENCOUNTER — Other Ambulatory Visit: Payer: Self-pay | Admitting: Family Medicine

## 2010-06-05 DIAGNOSIS — M899 Disorder of bone, unspecified: Secondary | ICD-10-CM

## 2010-06-06 ENCOUNTER — Other Ambulatory Visit: Payer: Self-pay | Admitting: Family Medicine

## 2010-06-11 ENCOUNTER — Ambulatory Visit (INDEPENDENT_AMBULATORY_CARE_PROVIDER_SITE_OTHER)
Admission: RE | Admit: 2010-06-11 | Discharge: 2010-06-11 | Disposition: A | Discharge status: Home / Self Care | Payer: Medicare Other | Source: Ambulatory Visit

## 2010-06-11 DIAGNOSIS — M899 Disorder of bone, unspecified: Secondary | ICD-10-CM

## 2010-06-11 DIAGNOSIS — M949 Disorder of cartilage, unspecified: Secondary | ICD-10-CM

## 2010-06-14 NOTE — Assessment & Plan Note (Signed)
Summary: RENEW MEDS   Vital Signs:  Patient profile:   70 year old female Height:      69 inches Weight:      209.25 pounds BMI:     31.01 Temp:     97.9 degrees F oral Pulse rate:   78 / minute Pulse rhythm:   regular BP sitting:   122 / 74  (left arm) Cuff size:   regular  Vitals Entered By: Linde Gillis CMA Duncan Dull) (June 04, 2010 11:22 AM) CC: renew medications   History of Present Illness: Ms. Rex is a very pleasant 70 year old female with h/o HTN, OA,  and HLD here for follow up HTN, HLD.  1. Hyperlipidemia- taking Pravachol 40 mg daily, not causing myalgias like Zocor did.  2.  HTN- controlled.  Has been compliant with meds, Aldactazide 25-25 and Amlodipine 5 mg,  and is walking daily.  No CP.HA or visual changes.  Due for DEXA scan. Agrees to go ahead with scheduling her colonscopy.  Current Medications (verified): 1)  Amlodipine Besylate 5 Mg  Tabs (Amlodipine Besylate) .Marland Kitchen.. 1 Once Daily 2)  Aldactazide 25-25 Mg  Tabs (Spironolactone-Hctz) .Marland Kitchen.. 1 Daily 3)  Pravachol 40 Mg Tabs (Pravastatin Sodium) .... Take 1 Tab By Mouth At Bedtime 4)  Aspirin 81 Mg Chew (Aspirin) .Marland Kitchen.. 1 Tab By Mouth Daily. 5)  Multivitamins  Tabs (Multiple Vitamin) .Marland Kitchen.. 1 Tab By Mouth Daily.  Allergies: 1)  ! Penicillin  Past History:  Past Medical History: Last updated: 05/15/2006 G3P3003, spider veins-ref`d to Dr. Orson Slick  Past Surgical History: Last updated: 03/27/2007 Lipid Panel 02/24/2006+ TC=204, TG=128, HDL=58, LDL=120 - 02/25/2006  Family History: Last updated: 05/15/2006 diabetes 1st degree- brother ,also colon CA, parents both deceased - heart disease in both;, sister w/ AAA  Social History: Last updated: 05/15/2006 retired from [employer]; works at [employer] to supplement income; enjoys travel, walking, playing the piano; lives alone; no etoh, cigs, or drugs;  Risk Factors: Smoking Status: never (09/29/2008)  Review of Systems      See HPI General:  Denies malaise. CV:   Denies chest pain or discomfort. Resp:  Denies shortness of breath.  Physical Exam  General:  alert, well-developed, and well-nourished, pleasant.   Mouth:  MMM Lungs:  Normal respiratory effort, chest expands symmetrically. Lungs are clear to auscultation, no crackles or wheezes. Heart:  Normal rate and regular rhythm. S1 and S2 normal without gallop, murmur, click, rub or other extra sounds. Extremities:  No clubbing, cyanosis, edema, or deformity noted with normal full range of motion of all joints.   Psych:  Cognition and judgment appear intact. Alert and cooperative with normal attention span and concentration. No apparent delusions, illusions, hallucinations   Impression & Recommendations:  Problem # 1:  R/O OSTEOPENIA (ICD-733.90) Assessment New Set up DEXA scan. Orders: Radiology Referral (Radiology)  Problem # 2:  HYPERTENSION, BENIGN SYSTEMIC (ICD-401.1) Assessment: Unchanged looks great, refill meds. Her updated medication list for this problem includes:    Amlodipine Besylate 5 Mg Tabs (Amlodipine besylate) .Marland Kitchen... 1 once daily    Aldactazide 25-25 Mg Tabs (Spironolactone-hctz) .Marland Kitchen... 1 daily  Orders: TLB-BMP (Basic Metabolic Panel-BMET) (80048-METABOL)  Problem # 3:  SCREENING, COLON CANCER (ICD-V76.51) Agreed to schedule colonscopy. Orders: Gastroenterology Referral (GI)  Problem # 4:  HYPERLIPIDEMIA (ICD-272.4) Assessment: Unchanged recheck lipid panel, doing well on Pravachol. Her updated medication list for this problem includes:    Pravachol 40 Mg Tabs (Pravastatin sodium) .Marland Kitchen... Take 1 tab by mouth at  bedtime  Orders: Venipuncture ([address]) TLB-Lipid Panel (80061-LIPID)  Complete Medication List: 1)  Amlodipine Besylate 5 Mg Tabs (Amlodipine besylate) .Marland Kitchen.. 1 once daily 2)  Aldactazide 25-25 Mg Tabs (Spironolactone-hctz) .Marland Kitchen.. 1 daily 3)  Pravachol 40 Mg Tabs (Pravastatin sodium) .... Take 1 tab by mouth at bedtime 4)  Aspirin 81 Mg Chew (Aspirin) .Marland Kitchen.. 1  tab by mouth daily. 5)  Multivitamins Tabs (Multiple vitamin) .Marland Kitchen.. 1 tab by mouth daily.  Patient Instructions: 1)  please stop by to see Shirlee Limerick on your way out. Prescriptions: PRAVACHOL 40 MG TABS (PRAVASTATIN SODIUM) Take 1 tab by mouth at bedtime  #90 x 4   Entered and Authorized by:   Ruthe Mannan MD   Signed by:   Ruthe Mannan MD on 06/04/2010   Method used:   [facility] to        [facility] (retail)       [address]       [place], [place]  [address]       Ph: [phone] or [phone]       Fax: [phone]   RxID:   [id number] AMLODIPINE BESYLATE 5 MG  TABS (AMLODIPINE BESYLATE) 1 once daily  #90 x 4   Entered and Authorized by:   Ruthe Mannan MD   Signed by:   Ruthe Mannan MD on 06/04/2010   Method used:   Electron[facility] to        [facility] (retail)       [address]       [place], [place]  [address]       Ph: [phone] or [phone]       Fax: [phone]   RxID:   [id number] ALDACTAZIDE 25-25 MG  TABS (SPIRONOLACTONE-HCTZ) 1 daily  #90 x 4   Entered and Authorized by:   Ruthe Mannan MD   Signed by:   Ruthe Mannan MD on 06/04/2010   Method used:   [facility]        [facility]  [address] ([facility])       [address]       [place], [place]  [address]       Ph: [phone] or [phone]       Fax: [phone]   RxID:   [id number]    Orders Added: 1)  Gastroenterology Referral [GI] 2)  Radiology Referral [Radiology] 3)  Venipuncture [[address]] 4)  TLB-Lipid Panel [80061-LIPID] 5)  TLB-BMP (Basic Metabolic Panel-BMET) [80048-METABOL] 6)  Est. Patient Level IV [[address]]    Current Allergies (reviewed today): ! PENICILLIN

## 2010-06-14 NOTE — Miscellaneous (Signed)
Summary: Orders Update  Clinical Lists Changes  Orders: Added new Test order of Dexa scan (Dexa scan) - Signed 

## 2010-06-29 ENCOUNTER — Encounter: Payer: Self-pay | Admitting: *Deleted

## 2010-07-03 ENCOUNTER — Encounter: Payer: Self-pay | Admitting: Family Medicine

## 2010-11-30 ENCOUNTER — Ambulatory Visit (INDEPENDENT_AMBULATORY_CARE_PROVIDER_SITE_OTHER): Payer: Medicare Other | Admitting: Family Medicine

## 2010-11-30 ENCOUNTER — Encounter: Payer: Self-pay | Admitting: Family Medicine

## 2010-11-30 VITALS — BP 110/70 | HR 70 | Temp 98.1°F | Wt 206.5 lb

## 2010-11-30 DIAGNOSIS — I10 Essential (primary) hypertension: Secondary | ICD-10-CM

## 2010-11-30 DIAGNOSIS — R5383 Other fatigue: Secondary | ICD-10-CM

## 2010-11-30 DIAGNOSIS — E785 Hyperlipidemia, unspecified: Secondary | ICD-10-CM

## 2010-11-30 DIAGNOSIS — R002 Palpitations: Secondary | ICD-10-CM

## 2010-11-30 MED ORDER — AMLODIPINE BESYLATE 5 MG PO TABS: 5.0000 mg | ORAL_TABLET | Freq: Every day | ORAL | Status: DC

## 2010-11-30 MED ORDER — SPIRONOLACTONE-HCTZ 25-25 MG PO TABS: 1.0000 | ORAL_TABLET | Freq: Every day | ORAL | Status: DC

## 2010-11-30 MED ORDER — PRAVASTATIN SODIUM 40 MG PO TABS: 40.0000 mg | ORAL_TABLET | Freq: Every day | ORAL | Status: DC

## 2010-11-30 NOTE — Progress Notes (Signed)
[name] is a very pleasant 70 year old female with h/o HTN, OA,  and HLD here for follow up HTN, HLD and to discuss fatigue.  1. Hyperlipidemia- taking Pravachol 40 mg daily, not causing myalgias like Zocor did. LDL 157 in 05/2010.  2.  HTN- controlled.  Has been compliant with meds, Aldactazide 25-25 and Amlodipine 5 mg,  and is walking daily.  No CP.HA or visual changes. 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  3.  Fatigue- last week, felt "off." Very tired, a few hot flashes and some nausea. Had some intermittent palpitations as well. No CP or SOB. No diaphoresis. Symptoms have since resolved.    Patient Active Problem List  Diagnoses  . HYPERLIPIDEMIA  . HYPERTENSION, BENIGN SYSTEMIC  . KNEE PAIN, BILATERAL  . POLYURIA  . SUBUNGUAL HEMATOMA    History  Substance Use Topics  . Smoking status: Never Smoker   . Smokeless tobacco: Not on file  . Alcohol Use: No   Family History  Problem Relation Age of Onset  . Heart disease Mother   . Heart disease Father   . Diabetes Brother   . Cancer Brother     colon   Allergies  Allergen Reactions  . Penicillins    Current Outpatient Prescriptions on File Prior to Visit  Medication Sig Dispense Refill  . amLODipine (NORVASC) 5 MG tablet Take 5 mg by mouth daily.        Marland Kitchen aspirin 81 MG tablet Take 81 mg by mouth daily.        . Multiple Vitamin (MULTIVITAMIN) capsule Take 1 capsule by mouth daily.        . pravastatin (PRAVACHOL) 40 MG tablet Take 40 mg by mouth at bedtime.        Marland Kitchen spironolactone-hydrochlorothiazide (ALDACTAZIDE) 25-25 MG per tablet Take 1 tablet by mouth daily.         The PMH, PSH, Social History, Family History, Medications, and allergies have been reviewed in [facility], and have been updated if relevant.   Review of Systems       See HPI General:  Denies malaise. CV:  Denies chest pain or discomfort. Resp:  Denies shortness of breath.  Physical Exam BP 110/70  Pulse 70  Temp(Src) 98.1 F (36.7  C) (Oral)  Wt 206 lb 8 oz (93.668 kg) General:  alert, well-developed, and well-nourished, pleasant.   Mouth:  MMM HEENT:  Neck supple Lungs:  Normal respiratory effort, chest expands symmetrically. Lungs are clear to auscultation, no crackles or wheezes. Heart:  Normal rate and regular rhythm. S1 and S2 normal without gallop, murmur, click, rub or other extra sounds. Extremities:  No clubbing, cyanosis, edema, or deformity noted with normal full range of motion of all joints.   Psych:  Cognition and judgment appear intact. Alert and cooperative with normal attention span and concentration. No apparent delusions, illusions, hallucinations  Assessment and Plan: 1. HYPERTENSION, BENIGN SYSTEMIC  Stable, continue current meds. Basic Metabolic Panel (BMET), amLODipine (NORVASC) 5 MG tablet, spironolactone-hydrochlorothiazide (ALDACTAZIDE) 25-25 MG per tablet  2. HYPERLIPIDEMIA  Unchanged.  Labs today. Lipid Profile, Hepatic function panel, pravastatin (PRAVACHOL) 40 MG tablet  3. Fatigue  ?viral, will check labs today. EKG -normal sinus rhythm.   CBC w/Diff, TSH, T4, free

## 2010-11-30 NOTE — Patient Instructions (Signed)
It was so wonderful to see you, Ms. Stfort. Please let me know when you know your graduation date!  If any of your symptoms return, please let me know.

## 2010-12-01 LAB — LIPID PANEL
Cholesterol: 209 mg/dL — ABNORMAL HIGH (ref 0–200)
HDL: 48 mg/dL (ref >39)
Total CHOL/HDL Ratio: 4.4 Ratio
Triglycerides: 83 mg/dL (ref <150)
VLDL: 17 mg/dL (ref 0–40)

## 2010-12-01 LAB — BASIC METABOLIC PANEL
Calcium: 10 mg/dL (ref 8.4–10.5)
Potassium: 3.5 mEq/L (ref 3.5–5.3)
Sodium: 136 mEq/L (ref 135–145)

## 2010-12-01 LAB — CBC WITH DIFFERENTIAL/PLATELET
Basophils Relative: 0 % (ref 0–1)
Eosinophils Absolute: 0.1 10*3/uL (ref 0.0–0.7)
Eosinophils Relative: 1 % (ref 0–5)
Lymphocytes Relative: 31 % (ref 12–46)
Lymphs Abs: 3.5 10*3/uL (ref 0.7–4.0)
MCH: 32 pg (ref 26.0–34.0)
MCV: 94 fL (ref 78.0–100.0)
Monocytes Absolute: 0.6 10*3/uL (ref 0.1–1.0)
Neutro Abs: 7.1 10*3/uL (ref 1.7–7.7)
Platelets: 291 10*3/uL (ref 150–400)
RDW: 13.3 % (ref 11.5–15.5)
WBC: 11.4 10*3/uL — ABNORMAL HIGH (ref 4.0–10.5)

## 2010-12-01 LAB — HEPATIC FUNCTION PANEL
ALT: 21 U/L (ref 0–35)
AST: 24 U/L (ref 0–37)
Total Protein: 8.1 g/dL (ref 6.0–8.3)

## 2010-12-01 LAB — TSH: TSH: 1.121 u[IU]/mL (ref 0.350–4.500)

## 2011-12-13 ENCOUNTER — Other Ambulatory Visit: Payer: Self-pay

## 2011-12-13 DIAGNOSIS — E785 Hyperlipidemia, unspecified: Secondary | ICD-10-CM

## 2011-12-13 DIAGNOSIS — I10 Essential (primary) hypertension: Secondary | ICD-10-CM

## 2011-12-13 MED ORDER — AMLODIPINE BESYLATE 5 MG PO TABS: 5.0000 mg | ORAL_TABLET | Freq: Every day | ORAL | Status: DC

## 2011-12-13 MED ORDER — SPIRONOLACTONE-HCTZ 25-25 MG PO TABS: 1.0000 | ORAL_TABLET | Freq: Every day | ORAL | Status: DC

## 2011-12-13 MED ORDER — PRAVASTATIN SODIUM 40 MG PO TABS: 40.0000 mg | ORAL_TABLET | Freq: Every day | ORAL | Status: DC

## 2011-12-13 NOTE — Telephone Encounter (Signed)
Pt's last refill expired; pt request refill on amlodipine,pravastatin and spironolactone-HCTZ until seen 02/04/12 for CPX. Pt notified sent to [facility].

## 2011-12-31 ENCOUNTER — Telehealth: Payer: Self-pay

## 2011-12-31 NOTE — Telephone Encounter (Signed)
Pt called to schedule appt; spoke with front office personnel, pt has pain and tingling lt arm/elbow, rt middle finger and hand painful and locks, joints painful. Pt also mentioned occasional chest pain and SOB on and off. Pt advised would transfer to speak with nurse(CAN) pt refused. Pt advised if symptoms worsen call 911. I called pt left v/m for pt to call back. Spoke with pts sister no other contact # and she will speak with pt later this AM. If pt has not spoken with our office, pts sister will advise pt to call.

## 2011-12-31 NOTE — Telephone Encounter (Signed)
Spoke with Dr Dayton Martes while pt on phone; Dr Dayton Martes said OK to wait for appt 01/01/12 but if condition changes or worsens call back or go to [facility] if needed. Pt verbalized understanding.

## 2011-12-31 NOTE — Telephone Encounter (Signed)
Pt does not have chest pain or SOB today; last noticed 12/28/11. No h/a or dizziness. Pt wants to wait to see Dr Dayton Martes on 01/01/12.Please advise.

## 2012-01-01 ENCOUNTER — Ambulatory Visit (INDEPENDENT_AMBULATORY_CARE_PROVIDER_SITE_OTHER): Payer: Medicare Other | Admitting: Family Medicine

## 2012-01-01 ENCOUNTER — Encounter: Payer: Self-pay | Admitting: Family Medicine

## 2012-01-01 ENCOUNTER — Ambulatory Visit (INDEPENDENT_AMBULATORY_CARE_PROVIDER_SITE_OTHER)
Admission: RE | Admit: 2012-01-01 | Discharge: 2012-01-01 | Disposition: A | Discharge status: Home / Self Care | Payer: Medicare Other | Source: Ambulatory Visit | Attending: Family Medicine | Admitting: Family Medicine

## 2012-01-01 VITALS — BP 122/82 | HR 72 | Temp 97.9°F | Wt 208.0 lb

## 2012-01-01 DIAGNOSIS — M5412 Radiculopathy, cervical region: Secondary | ICD-10-CM

## 2012-01-01 DIAGNOSIS — M2550 Pain in unspecified joint: Secondary | ICD-10-CM

## 2012-01-01 DIAGNOSIS — E785 Hyperlipidemia, unspecified: Secondary | ICD-10-CM

## 2012-01-01 DIAGNOSIS — M5410 Radiculopathy, site unspecified: Secondary | ICD-10-CM

## 2012-01-01 DIAGNOSIS — R0602 Shortness of breath: Secondary | ICD-10-CM | POA: Insufficient documentation

## 2012-01-01 DIAGNOSIS — I10 Essential (primary) hypertension: Secondary | ICD-10-CM

## 2012-01-01 LAB — CBC WITH DIFFERENTIAL/PLATELET
Basophils Absolute: 0 10*3/uL (ref 0.0–0.1)
Basophils Relative: 0.3 % (ref 0.0–3.0)
Eosinophils Absolute: 0.1 10*3/uL (ref 0.0–0.7)
Lymphocytes Relative: 20.6 % (ref 12.0–46.0)
MCHC: 33.2 g/dL (ref 30.0–36.0)
MCV: 94.3 fl (ref 78.0–100.0)
Monocytes Absolute: 0.6 10*3/uL (ref 0.1–1.0)
Neutrophils Relative %: 70.9 % (ref 43.0–77.0)
Platelets: 273 10*3/uL (ref 150.0–400.0)
RBC: 4.18 Mil/uL (ref 3.87–5.11)
RDW: 12.8 % (ref 11.5–14.6)

## 2012-01-01 LAB — LIPID PANEL
LDL Cholesterol: 114 mg/dL — ABNORMAL HIGH (ref 0–99)
Total CHOL/HDL Ratio: 4
Triglycerides: 127 mg/dL (ref 0.0–149.0)
VLDL: 25.4 mg/dL (ref 0.0–40.0)

## 2012-01-01 LAB — COMPREHENSIVE METABOLIC PANEL
AST: 21 U/L (ref 0–37)
Albumin: 3.8 g/dL (ref 3.5–5.2)
Alkaline Phosphatase: 56 U/L (ref 39–117)
BUN: 9 mg/dL (ref 6–23)
Potassium: 3.7 mEq/L (ref 3.5–5.1)
Sodium: 136 mEq/L (ref 135–145)
Total Bilirubin: 0.7 mg/dL (ref 0.3–1.2)

## 2012-01-01 LAB — SEDIMENTATION RATE: Sed Rate: 34 mm/hr — ABNORMAL HIGH (ref 0–22)

## 2012-01-01 MED ORDER — SPIRONOLACTONE-HCTZ 25-25 MG PO TABS: 1.0000 | ORAL_TABLET | Freq: Every day | ORAL | Status: DC

## 2012-01-01 MED ORDER — CYCLOBENZAPRINE HCL 5 MG PO TABS
5.0000 mg | ORAL_TABLET | Freq: Two times a day (BID) | ORAL | Status: DC | PRN
Start: 2012-01-01 — End: 2012-08-20

## 2012-01-01 MED ORDER — AMLODIPINE BESYLATE 5 MG PO TABS: 5.0000 mg | ORAL_TABLET | Freq: Every day | ORAL | Status: DC

## 2012-01-01 MED ORDER — PRAVASTATIN SODIUM 40 MG PO TABS: 40.0000 mg | ORAL_TABLET | Freq: Every day | ORAL | Status: DC

## 2012-01-01 NOTE — Patient Instructions (Addendum)
It was wonderful to see you as always, Ms. Seamans. We will call you with your lab and xray results.  Let's try Pepcid 20 mg daily for the next 2 weeks. Increase fiber and water, and try over the counter gas-x (four times a day when necessary) or beano for bloating.  Exclude gas producing foods (beans, onions, celery, carrots, raisins, bananas, apricots, prunes, brussel sprouts, wheat germ, pretzels)    For your neck/shoulder/arm- try the exercises we gave you and flexeril as needed at night.

## 2012-01-01 NOTE — Progress Notes (Signed)
Ebony Knox is a very pleasant 71 year old female with h/o HTN, OA,  and HLD here for acute visit for:  1.  Left arm tingling- past month or two- tingling from upper arm to finger tips.  Getting progressively worse- she now feels it constantly. No UE weakness or decreased grip strength.  She did have an episode of right hand pain as well but that has improved.  She still feels like her right middle finger is swollen.  2.  CP and SOB- four days ago, was walking to the kitchen and had sharp substernal pain.  It was associated with SOB and she does now have some DOE.  No recurrent CP. Took a baby aspirin and something for indigestion and it resolved. No n/v/d. No recent travel. She is not on any hormone replacement therapy. She does have a h/o HLD.  On Pravachol.  She is quite active- walks several miles multiple times per week.  Noticing DOE more going up stairs than walking outside- she actually feel like fresh air makes it better.  Lab Results  Component Value Date   CHOL 209* 11/30/2010   HDL 48 11/30/2010   LDLCALC 144* 11/30/2010   LDLDIRECT 151.7 06/04/2010   TRIG 83 11/30/2010   CHOLHDL 4.4 11/30/2010    Does have family h/o heart disease.   Patient Active Problem List  Diagnosis  . HYPERLIPIDEMIA  . HYPERTENSION, BENIGN SYSTEMIC  . KNEE PAIN, BILATERAL  . POLYURIA  . SUBUNGUAL HEMATOMA  . Fatigue  . SOB (shortness of breath)  . Radiculopathy of arm    History  Substance Use Topics  . Smoking status: Never Smoker   . Smokeless tobacco: Not on file  . Alcohol Use: No   Family History  Problem Relation Age of Onset  . Heart disease Mother   . Heart disease Father   . Diabetes Brother   . Cancer Brother     colon   Allergies  Allergen Reactions  . Penicillins    Current Outpatient Prescriptions on File Prior to Visit  Medication Sig Dispense Refill  . amLODipine (NORVASC) 5 MG tablet Take 1 tablet (5 mg total) by mouth daily.  30 tablet  1  . aspirin 81 MG  tablet Take 81 mg by mouth daily.        . Multiple Vitamin (MULTIVITAMIN) capsule Take 1 capsule by mouth daily.        . Omega-3 Fatty Acids (FISH OIL) 1000 MG CAPS Take 1 capsule by mouth daily.        . pravastatin (PRAVACHOL) 40 MG tablet Take 1 tablet (40 mg total) by mouth at bedtime.  30 tablet  1  . spironolactone-hydrochlorothiazide (ALDACTAZIDE) 25-25 MG per tablet Take 1 tablet by mouth daily.  30 tablet  1  . vitamin B-12 (CYANOCOBALAMIN) 1000 MCG tablet Take 1,000 mcg by mouth daily.         The PMH, PSH, Social History, Family History, Medications, and allergies have been reviewed in [facility], and have been updated if relevant.   Review of Systems       See HPI General:  Denies malaise. CVS:  No palpitations No leg pain  Physical Exam BP 122/82  Pulse 72  Temp 97.9 F (36.6 C)  Wt 208 lb (94.348 kg) General:  alert, well-developed, and well-nourished, pleasant.   Mouth:  MMM HEENT:  Neck supple Lungs:  Normal respiratory effort, chest expands symmetrically. Lungs are clear to auscultation, no crackles or wheezes. Heart:  Normal rate and regular rhythm. S1 and S2 normal without gallop, murmur, click, rub or other extra sounds. Extremities:  No clubbing, cyanosis, edema, or deformity noted with normal full range of motion of all joints.   Psych:  Cognition and judgment appear intact. Alert and cooperative with normal attention span and concentration. No apparent delusions, illusions, hallucinations Ext:  Normal grip strength bilaterally, FROM of neck and shoulders- no tenderness over cervical spine.  She does have some tenderness of left trapezius muscle.  Assessment and Plan:  1. SOB (shortness of breath)  New- EKG reassuring- NSR, unlikely cardiac. ?CP and SOB associated with GERD- see pt instructions for treatment. Will also check a D dimer to rule out PE although she is very low risk. Comprehensive metabolic panel, D-dimer, quantitative, CBC with Differential, EKG  12-Lead  2. 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 Will order xray of cervical spine today for further evaluation. Given exercises from sports med advisor. Also flexeril as needed for spasms. If no improvement and xray unremarkable, consider MRI. DG Cervical Spine Complete  3. HYPERLIPIDEMIA  Stable.  Recheck lipid panel today. Comprehensive metabolic panel, Lipid Panel  4. HYPERTENSION, BENIGN SYSTEMIC  Stable. Comprehensive metabolic panel  5. Joint pain  New- most likely OA but will check SED rate and RF to rule out rheum arthritis. The patient indicates understanding of these issues and agrees with the plan.  Rheumatoid Factor, Sedimentation Rate

## 2012-01-02 LAB — RHEUMATOID FACTOR: Rhuematoid fact SerPl-aCnc: <10 IU/mL (ref <=14)

## 2012-01-02 LAB — D-DIMER, QUANTITATIVE: D-Dimer, Quant: 0.27 ug{FEU}/mL (ref 0.00–0.48)

## 2012-01-06 ENCOUNTER — Telehealth: Payer: Self-pay | Admitting: *Deleted

## 2012-01-06 NOTE — Telephone Encounter (Signed)
Prior Berkley Harvey is needed for cyclobenzaprine, form is on your desk.

## 2012-01-06 NOTE — Telephone Encounter (Signed)
In my box

## 2012-01-06 NOTE — Telephone Encounter (Signed)
Prior auth given for cyclobenzaprine, advised pharmacy. Approval letter placed on doctor's desk for signature and scanning.

## 2012-01-06 NOTE — Telephone Encounter (Signed)
Form faxed

## 2012-01-24 ENCOUNTER — Ambulatory Visit: Payer: Medicare Other

## 2012-01-30 ENCOUNTER — Other Ambulatory Visit: Payer: Medicare Other

## 2012-02-04 ENCOUNTER — Encounter: Payer: Medicare Other | Admitting: Family Medicine

## 2012-02-14 ENCOUNTER — Ambulatory Visit: Payer: Self-pay | Admitting: Family Medicine

## 2012-02-14 ENCOUNTER — Encounter: Payer: Self-pay | Admitting: Family Medicine

## 2012-02-14 ENCOUNTER — Ambulatory Visit (INDEPENDENT_AMBULATORY_CARE_PROVIDER_SITE_OTHER): Payer: Medicare Other | Admitting: Family Medicine

## 2012-02-14 ENCOUNTER — Telehealth: Payer: Self-pay | Admitting: Family Medicine

## 2012-02-14 VITALS — BP 124/80 | HR 90 | Temp 97.8°F | Ht 69.0 in | Wt 210.5 lb

## 2012-02-14 DIAGNOSIS — J209 Acute bronchitis, unspecified: Secondary | ICD-10-CM | POA: Insufficient documentation

## 2012-02-14 MED ORDER — AZITHROMYCIN 250 MG PO TABS: ORAL_TABLET | ORAL | Status: DC

## 2012-02-14 NOTE — Patient Instructions (Addendum)
Push fluids, rest. Continue claritin. Start mucinex DM (robutussin DM) during the day and night for cough. If not improving in the next 48 hours call, or call earlier if fever or shortness of breath on antibiotics.

## 2012-02-14 NOTE — Assessment & Plan Note (Signed)
Treat with antibiotics given > 10 days of symtpoms.  No clear PNA.

## 2012-02-14 NOTE — Progress Notes (Signed)
  Subjective:    Patient ID: Ebony Knox, female    DOB: 01/11/1941, 71 y.o.   MRN: [id number]  Cough This is a new problem. The current episode started 1 to 4 weeks ago (Felt poorly starting on 11/15). The problem has been gradually worsening. The problem occurs constantly. The cough is non-productive. Associated symptoms include nasal congestion, rhinorrhea and a sore throat. Pertinent negatives include no chest pain, chills, ear congestion, ear pain, fever, postnasal drip, shortness of breath or wheezing. Associated symptoms comments: sweats. Risk factors for lung disease include travel (Nonsmoker). She has tried OTC cough suppressant (robitussin Dm, claritin) for the symptoms. The treatment provided mild relief. There is no history of asthma, COPD, emphysema or environmental allergies.      Review of Systems  Constitutional: Negative for fever, chills and fatigue.  HENT: Positive for sore throat and rhinorrhea. Negative for ear pain and postnasal drip.   Eyes: Negative for pain.  Respiratory: Positive for cough. Negative for chest tightness, shortness of breath and wheezing.   Cardiovascular: Negative for chest pain, palpitations and leg swelling.  Gastrointestinal: Negative for abdominal pain.  Genitourinary: Negative for dysuria.  Hematological: Negative for environmental allergies.       Objective:   Physical Exam  Constitutional: Vital signs are normal. She appears well-developed and well-nourished. She is cooperative.  Non-toxic appearance. She does not appear ill. No distress.  HENT:  Head: Normocephalic.  Right Ear: Hearing, tympanic membrane, external ear and ear canal normal. Tympanic membrane is not erythematous, not retracted and not bulging.  Left Ear: Hearing, tympanic membrane, external ear and ear canal normal. Tympanic membrane is not erythematous, not retracted and not bulging.  Nose: Mucosal edema and rhinorrhea present. Right sinus exhibits no maxillary sinus  tenderness and no frontal sinus tenderness. Left sinus exhibits no maxillary sinus tenderness and no frontal sinus tenderness.  Mouth/Throat: Uvula is midline, oropharynx is clear and moist and mucous membranes are normal.  Eyes: Conjunctivae normal, EOM and lids are normal. Pupils are equal, round, and reactive to light. No foreign bodies found.  Neck: Trachea normal and normal range of motion. Neck supple. Carotid bruit is not present. No mass and no thyromegaly present.  Cardiovascular: Normal rate, regular rhythm, S1 normal, S2 normal, normal heart sounds, intact distal pulses and normal pulses.  Exam reveals no gallop and no friction rub.   No murmur heard. Pulmonary/Chest: Effort normal and breath sounds normal. Not tachypneic. No respiratory distress. She has no decreased breath sounds. She has no wheezes. She has no rhonchi. She has no rales.  Neurological: She is alert.  Skin: Skin is warm, dry and intact. No rash noted.  Psychiatric: Her speech is normal and behavior is normal. Judgment normal. Her mood appears not anxious. Cognition and memory are normal. She does not exhibit a depressed mood.          Assessment & Plan:

## 2012-02-14 NOTE — Telephone Encounter (Signed)
Patient Information:  Caller Name: Tyiona  Phone: [phone]  Patient: Ebony Knox, Ebony Knox  Gender: Female  DOB: Sep 06, 1940  Age: 71 Years  PCP: [facility])   Symptoms  Reason For Call & Symptoms: cold and cough  Reviewed Health History In EMR: Yes  Reviewed Medications In EMR: Yes  Reviewed Allergies In EMR: Yes  Reviewed Surgeries / Procedures: Yes  Date of Onset of Symptoms: 01/31/2012  Treatments Tried: Robitussin DM, Claritan, tea and honey  Treatments Tried Worked: No  Guideline(s) Used:  Cold Symptoms (Upper Respiratory Infection)  Colds  Disposition Per Guideline:   See Today or Tomorrow in Office  Reason For Disposition Reached:   Nasal discharge present > 10 days  Advice Given:  Humidifier:  If the air in your home is dry, use a cool-mist humidifier   Office Follow Up:  Does the office need to follow up with this patient?: No  Instructions For The Office: N/A  Appointment Scheduled:  02/14/2012 11:45:00

## 2012-08-20 ENCOUNTER — Encounter: Payer: Self-pay | Admitting: Family Medicine

## 2012-08-20 ENCOUNTER — Ambulatory Visit (INDEPENDENT_AMBULATORY_CARE_PROVIDER_SITE_OTHER): Payer: Medicare Other | Admitting: Family Medicine

## 2012-08-20 VITALS — BP 118/78 | HR 72 | Temp 97.8°F | Wt 210.0 lb

## 2012-08-20 DIAGNOSIS — J309 Allergic rhinitis, unspecified: Secondary | ICD-10-CM

## 2012-08-20 MED ORDER — FLUTICASONE PROPIONATE 50 MCG/ACT NA SUSP: 2.0000 | Freq: Every day | NASAL | Status: DC

## 2012-08-20 NOTE — Progress Notes (Signed)
  Subjective:    Patient ID: Ebony Knox, female    DOB: January 21, 1941, 72 y.o.   MRN: [id number]  HPI  Very pleasant 72 yo female here for tickle in her throat x 1 week.  Dry cough.  No SOB or fatigue.  Does take Claritin daily.  No fever.  No sinus pressure.  Does sneeze occasionally. No CP.  Patient Active Problem List   Diagnosis Date Noted  . Allergic rhinitis 08/20/2012  . Acute bronchitis 02/14/2012  . Radiculopathy of arm 01/01/2012  . Fatigue 11/30/2010  . KNEE PAIN, BILATERAL 11/19/2006  . SUBUNGUAL HEMATOMA 11/19/2006  . HYPERLIPIDEMIA 05/15/2006  . HYPERTENSION, BENIGN SYSTEMIC 05/15/2006  . POLYURIA 05/15/2006   No past medical history on file. No past surgical history on file. History  Substance Use Topics  . Smoking status: Never Smoker   . Smokeless tobacco: Not on file  . Alcohol Use: No   Family History  Problem Relation Age of Onset  . Heart disease Mother   . Heart disease Father   . Diabetes Brother   . Cancer Brother     colon   Allergies  Allergen Reactions  . Penicillins    Current Outpatient Prescriptions on File Prior to Visit  Medication Sig Dispense Refill  . amLODipine (NORVASC) 5 MG tablet Take 1 tablet (5 mg total) by mouth daily.  30 tablet  6  . aspirin 81 MG tablet Take 81 mg by mouth daily.        . calcium carbonate (OS-CAL) 600 MG TABS Take 600 mg by mouth daily.      . cholecalciferol (VITAMIN D) 1000 UNITS tablet Take 1,000 Units by mouth daily.      . Multiple Vitamin (MULTIVITAMIN) capsule Take 1 capsule by mouth daily.        . pravastatin (PRAVACHOL) 40 MG tablet Take 1 tablet (40 mg total) by mouth at bedtime.  30 tablet  6  . spironolactone-hydrochlorothiazide (ALDACTAZIDE) 25-25 MG per tablet Take 1 tablet by mouth daily.  30 tablet  6  . vitamin B-12 (CYANOCOBALAMIN) 1000 MCG tablet Take 1,000 mcg by mouth daily.        . Biotin 1000 MCG tablet Take 1,000 mcg by mouth daily.       No current facility-administered  medications on file prior to visit.   The PMH, PSH, Social History, Family History, Medications, and allergies have been reviewed in [facility], and have been updated if relevant.   Review of Systems    See HPI Objective:   Physical Exam BP 118/78  Pulse 72  Temp(Src) 97.8 F (36.6 C)  Wt 210 lb (95.255 kg)  BMI 31 kg/m2 Gen:  Alert, pleasant, NAD HEENT:  MMM, + PND, Tms clear bilaterally Resp:  CTA bilaterally CVS:  RRR Ext:  No edema    Assessment & Plan:  1. Allergic rhinitis Deteriorated.  Continue Claritin. Add flonase. Call or return to clinic prn if these symptoms worsen or fail to improve as anticipated. The patient indicates understanding of these issues and agrees with the plan.

## 2012-08-20 NOTE — Patient Instructions (Addendum)
Great to see you. You can try saline nasal sprays over the counter. Continue Claritin. I sent in a prescription for flonase (nasal spray)- 2 sprays daily. Call me in 2 weeks.

## 2012-09-04 ENCOUNTER — Telehealth: Payer: Self-pay

## 2012-09-04 NOTE — Telephone Encounter (Signed)
Pt left v/m pt seen 08/20/12 and pt was to call back; allergy medicine is working well. Pt has appt already scheduled in 10/2012.

## 2012-09-05 NOTE — Telephone Encounter (Signed)
Glad to hear it is working.

## 2012-10-29 ENCOUNTER — Other Ambulatory Visit: Payer: Self-pay | Admitting: Family Medicine

## 2012-10-29 DIAGNOSIS — E538 Deficiency of other specified B group vitamins: Secondary | ICD-10-CM

## 2012-10-29 DIAGNOSIS — M25561 Pain in right knee: Secondary | ICD-10-CM

## 2012-10-29 DIAGNOSIS — E785 Hyperlipidemia, unspecified: Secondary | ICD-10-CM

## 2012-10-29 DIAGNOSIS — I10 Essential (primary) hypertension: Secondary | ICD-10-CM

## 2012-11-02 ENCOUNTER — Other Ambulatory Visit (INDEPENDENT_AMBULATORY_CARE_PROVIDER_SITE_OTHER): Payer: Medicare Other

## 2012-11-02 DIAGNOSIS — R5383 Other fatigue: Secondary | ICD-10-CM

## 2012-11-02 DIAGNOSIS — E538 Deficiency of other specified B group vitamins: Secondary | ICD-10-CM

## 2012-11-02 DIAGNOSIS — E785 Hyperlipidemia, unspecified: Secondary | ICD-10-CM

## 2012-11-02 DIAGNOSIS — R5381 Other malaise: Secondary | ICD-10-CM

## 2012-11-02 DIAGNOSIS — I10 Essential (primary) hypertension: Secondary | ICD-10-CM

## 2012-11-02 LAB — LIPID PANEL
Cholesterol: 226 mg/dL — ABNORMAL HIGH (ref 0–200)
Total CHOL/HDL Ratio: 4
Triglycerides: 105 mg/dL (ref 0.0–149.0)

## 2012-11-02 LAB — COMPREHENSIVE METABOLIC PANEL
BUN: 10 mg/dL (ref 6–23)
CO2: 28 mEq/L (ref 19–32)
Calcium: 9.1 mg/dL (ref 8.4–10.5)
Chloride: 104 mEq/L (ref 96–112)
Creatinine, Ser: 0.7 mg/dL (ref 0.4–1.2)
GFR: 100.83 mL/min (ref >60.00)

## 2012-11-05 ENCOUNTER — Ambulatory Visit (INDEPENDENT_AMBULATORY_CARE_PROVIDER_SITE_OTHER): Payer: Medicare Other | Admitting: Family Medicine

## 2012-11-05 ENCOUNTER — Encounter: Payer: Self-pay | Admitting: Family Medicine

## 2012-11-05 VITALS — BP 140/84 | HR 70 | Temp 97.7°F | Ht 69.0 in | Wt 220.0 lb

## 2012-11-05 DIAGNOSIS — E785 Hyperlipidemia, unspecified: Secondary | ICD-10-CM

## 2012-11-05 DIAGNOSIS — Z1331 Encounter for screening for depression: Secondary | ICD-10-CM

## 2012-11-05 DIAGNOSIS — Z1211 Encounter for screening for malignant neoplasm of colon: Secondary | ICD-10-CM

## 2012-11-05 DIAGNOSIS — I10 Essential (primary) hypertension: Secondary | ICD-10-CM

## 2012-11-05 DIAGNOSIS — Z0000 Encounter for general adult medical examination without abnormal findings: Secondary | ICD-10-CM

## 2012-11-05 DIAGNOSIS — Z1239 Encounter for other screening for malignant neoplasm of breast: Secondary | ICD-10-CM

## 2012-11-05 DIAGNOSIS — J309 Allergic rhinitis, unspecified: Secondary | ICD-10-CM

## 2012-11-05 DIAGNOSIS — Z1231 Encounter for screening mammogram for malignant neoplasm of breast: Secondary | ICD-10-CM

## 2012-11-05 MED ORDER — SPIRONOLACTONE-HCTZ 25-25 MG PO TABS: 1.0000 | ORAL_TABLET | Freq: Every day | ORAL | Status: DC

## 2012-11-05 MED ORDER — AMLODIPINE BESYLATE 5 MG PO TABS: 5.0000 mg | ORAL_TABLET | Freq: Every day | ORAL | Status: DC

## 2012-11-05 NOTE — Progress Notes (Signed)
Ebony Knox is a very pleasant 72 year old female with h/o HTN, OA,  and HLD here for annual medicare wellness visit.  I have personally reviewed the Medicare Annual Wellness questionnaire and have noted 1. The patient's medical and social history 2. Their use of alcohol, tobacco or illicit drugs 3. Their current medications and supplements 4. The patient's functional ability including ADL's, fall risks, home safety risks and hearing or visual             impairment. 5. Diet and physical activities 6. Evidence for depression or mood disorders  End of life wishes discussed and updated in Social History.   1. Hyperlipidemia- deteriorated.   Admits to not sticking her diet since she graduated.  Ate 3 bowls of ice cream the other night!  Has gained 10 pounds since 08/2012.  Still taking her pravachol nightly.  Wt Readings from Last 3 Encounters:  11/05/12 220 lb (99.791 kg)  08/20/12 210 lb (95.255 kg)  02/14/12 210 lb 8 oz (95.482 kg)    Lab Results  Component Value Date   CHOL 226* 11/02/2012   HDL 52.20 11/02/2012   LDLCALC 114* 01/01/2012   LDLDIRECT 163.8 11/02/2012   TRIG 105.0 11/02/2012   CHOLHDL 4 11/02/2012    2.  HTN- controlled.  Has been compliant with meds, Aldactazide 25-25 and Amlodipine 5 mg,  and is walking daily.  No CP.HA or visual changes.  Lab Results  Component Value Date   CREATININE 0.7 11/02/2012      Patient Active Problem List   Diagnosis Date Noted  . Encounter for Medicare annual wellness exam 11/05/2012  . Allergic rhinitis 08/20/2012  . HYPERLIPIDEMIA 05/15/2006  . HYPERTENSION, BENIGN SYSTEMIC 05/15/2006    History  Substance Use Topics  . Smoking status: Never Smoker   . Smokeless tobacco: Not on file  . Alcohol Use: No   Family History  Problem Relation Age of Onset  . Heart disease Mother   . Heart disease Father   . Diabetes Brother   . Cancer Brother     colon   Allergies  Allergen Reactions  . Penicillins    Current  Outpatient Prescriptions on File Prior to Visit  Medication Sig Dispense Refill  . amLODipine (NORVASC) 5 MG tablet Take 1 tablet (5 mg total) by mouth daily.  30 tablet  6  . aspirin 81 MG tablet Take 81 mg by mouth daily.        . Biotin 1000 MCG tablet Take 1,000 mcg by mouth daily.      . calcium carbonate (OS-CAL) 600 MG TABS Take 600 mg by mouth daily.      . cholecalciferol (VITAMIN D) 1000 UNITS tablet Take 1,000 Units by mouth daily.      . fluticasone (FLONASE) 50 MCG/ACT nasal spray Place 2 sprays into the nose daily.  16 g  6  . Multiple Vitamin (MULTIVITAMIN) capsule Take 1 capsule by mouth daily.        . pravastatin (PRAVACHOL) 40 MG tablet Take 1 tablet (40 mg total) by mouth at bedtime.  30 tablet  6  . spironolactone-hydrochlorothiazide (ALDACTAZIDE) 25-25 MG per tablet Take 1 tablet by mouth daily.  30 tablet  6  . vitamin B-12 (CYANOCOBALAMIN) 1000 MCG tablet Take 1,000 mcg by mouth daily.         No current facility-administered medications on file prior to visit.   The PMH, PSH, Social History, Family History, Medications, and allergies have been reviewed  in [facility], and have been updated if relevant.   Review of Systems       See HPI Patient reports no  vision/ hearing changes,anorexia, weight change, fever ,adenopathy, persistant / recurrent hoarseness, swallowing issues, chest pain, edema,persistant / recurrent cough, hemoptysis, dyspnea(rest, exertional, paroxysmal nocturnal), gastrointestinal  bleeding (melena, rectal bleeding), abdominal pain, excessive heart burn, GU symptoms(dysuria, hematuria, pyuria, voiding/incontinence  Issues) syncope, focal weakness, severe memory loss, concerning skin lesions, depression, anxiety, abnormal bruising/bleeding, major joint swelling, breast masses or abnormal vaginal bleeding.     Physical Exam BP 140/84  Pulse 70  Temp(Src) 97.7 F (36.5 C)  Ht 5\' 9"  (1.753 m)  Wt 220 lb (99.791 kg)  BMI 32.47 kg/m2 General:  alert,  well-developed, and well-nourished, pleasant.   Mouth:  MMM HEENT:  Neck supple Lungs:  Normal respiratory effort, chest expands symmetrically. Lungs are clear to auscultation, no crackles or wheezes. Heart:  Normal rate and regular rhythm. S1 and S2 normal without gallop, murmur, click, rub or other extra sounds. Extremities:  No clubbing, cyanosis, edema, or deformity noted with normal full range of motion of all joints.   Psych:  Cognition and judgment appear intact. Alert and cooperative with normal attention span and concentration. No apparent delusions, illusions, hallucinations  Assessment and Plan: 1. HYPERTENSION, BENIGN SYSTEMIC Stable on current rx. - spironolactone-hydrochlorothiazide (ALDACTAZIDE) 25-25 MG per tablet; Take 1 tablet by mouth daily.  Dispense: 30 tablet; Refill: 5 - amLODipine (NORVASC) 5 MG tablet; Take 1 tablet (5 mg total) by mouth daily.  Dispense: 30 tablet; Refill: 5  2. HYPERLIPIDEMIA Deteriorated.  She is motivated to change diet.  No change in rx for now, recheck labs in 3 months. The patient indicates understanding of these issues and agrees with the plan.   3. Encounter for Medicare annual wellness exam The patients weight, height, BMI and visual acuity have been recorded in the chart I have made referrals, counseling and provided education to the patient based review of the above and I have provided the pt with a written personalized care plan for preventive services.  Declining pneumovax and zostavax at this time.   4. Allergic rhinitis Improved.  5. Special screening for malignant neoplasms, colon  - Ambulatory referral to Gastroenterology  6. Breast cancer screening  - MM Digital Screening; Future

## 2012-11-05 NOTE — Patient Instructions (Addendum)
Good to see you. Please think about getting your pneumonia shot.  We will or [facility] will call with your colonoscopy appointment.  Let's work on [employer].  Come back in 3 months to recheck your lab work.

## 2012-11-09 ENCOUNTER — Encounter: Payer: Self-pay | Admitting: Internal Medicine

## 2013-01-04 ENCOUNTER — Ambulatory Visit (HOSPITAL_COMMUNITY)
Admission: RE | Admit: 2013-01-04 | Discharge: 2013-01-04 | Disposition: A | Discharge status: Home / Self Care | Payer: Medicare Other | Source: Ambulatory Visit | Attending: Family Medicine | Admitting: Family Medicine

## 2013-01-04 DIAGNOSIS — Z1231 Encounter for screening mammogram for malignant neoplasm of breast: Secondary | ICD-10-CM | POA: Insufficient documentation

## 2013-01-28 ENCOUNTER — Other Ambulatory Visit: Payer: Self-pay | Admitting: Internal Medicine

## 2013-01-28 DIAGNOSIS — E785 Hyperlipidemia, unspecified: Secondary | ICD-10-CM

## 2013-02-01 ENCOUNTER — Encounter: Payer: Medicare Other | Admitting: Internal Medicine

## 2013-02-02 ENCOUNTER — Other Ambulatory Visit: Payer: Self-pay | Admitting: Family Medicine

## 2013-02-05 ENCOUNTER — Other Ambulatory Visit (INDEPENDENT_AMBULATORY_CARE_PROVIDER_SITE_OTHER): Payer: Medicare Other

## 2013-02-05 DIAGNOSIS — E785 Hyperlipidemia, unspecified: Secondary | ICD-10-CM

## 2013-02-05 DIAGNOSIS — I10 Essential (primary) hypertension: Secondary | ICD-10-CM

## 2013-02-05 LAB — LDL CHOLESTEROL, DIRECT: Direct LDL: 142.4 mg/dL

## 2013-02-05 LAB — HEPATIC FUNCTION PANEL
ALT: 21 U/L (ref 0–35)
Albumin: 3.9 g/dL (ref 3.5–5.2)
Alkaline Phosphatase: 71 U/L (ref 39–117)
Bilirubin, Direct: 0 mg/dL (ref 0.0–0.3)
Total Bilirubin: 0.2 mg/dL — ABNORMAL LOW (ref 0.3–1.2)

## 2013-02-05 LAB — LIPID PANEL
Cholesterol: 206 mg/dL — ABNORMAL HIGH (ref 0–200)
Total CHOL/HDL Ratio: 4
VLDL: 26 mg/dL (ref 0.0–40.0)

## 2013-02-09 ENCOUNTER — Encounter: Payer: Self-pay | Admitting: *Deleted

## 2013-03-22 ENCOUNTER — Telehealth: Payer: Self-pay | Admitting: Internal Medicine

## 2013-03-22 ENCOUNTER — Ambulatory Visit (AMBULATORY_SURGERY_CENTER): Payer: Self-pay | Admitting: *Deleted

## 2013-03-22 VITALS — Ht 69.0 in | Wt 218.8 lb

## 2013-03-22 DIAGNOSIS — Z1211 Encounter for screening for malignant neoplasm of colon: Secondary | ICD-10-CM

## 2013-03-22 MED ORDER — MOVIPREP 100 G PO SOLR: 1.0000 | Freq: Once | ORAL | Status: DC

## 2013-03-22 NOTE — Progress Notes (Signed)
Denies allergies to eggs or soy products. Denies complications with sedation or anesthesia. 

## 2013-03-22 NOTE — Telephone Encounter (Signed)
Attempted to return phone call to patient, no answer. Message left that a free moviprep voucher would be left for her at the front desk of the 4th floor to pickup until 03/26/2013. If she is unable to pick up that voucher then she will need to call and make other arrangements.

## 2013-03-26 ENCOUNTER — Encounter: Payer: Self-pay | Admitting: Internal Medicine

## 2013-03-29 ENCOUNTER — Telehealth: Payer: Self-pay | Admitting: Internal Medicine

## 2013-03-29 NOTE — Telephone Encounter (Signed)
Patient was concerned about taking her BP meds the day of her procedure, which is tomorrow morning.  I advised her that she can take her Norvasc but hold her Spironolactone with HCTZ due to it being a diuretic.  She also needed to be reassured about when to drink prep and what she can have today to drink and eat. I explained to her in detail her instructions again and she understood.  She states that she ate some scramble eggs this a.m. And I advised her that a light breakfast was okay.  All questions were answered.

## 2013-03-30 ENCOUNTER — Ambulatory Visit (AMBULATORY_SURGERY_CENTER): Payer: Medicare Other | Admitting: Internal Medicine

## 2013-03-30 ENCOUNTER — Encounter: Payer: Self-pay | Admitting: Internal Medicine

## 2013-03-30 VITALS — BP 108/58 | HR 64 | Temp 96.7°F | Resp 22 | Ht 69.0 in | Wt 218.0 lb

## 2013-03-30 DIAGNOSIS — D126 Benign neoplasm of colon, unspecified: Secondary | ICD-10-CM

## 2013-03-30 DIAGNOSIS — Z1211 Encounter for screening for malignant neoplasm of colon: Secondary | ICD-10-CM

## 2013-03-30 MED ORDER — SODIUM CHLORIDE 0.9 % IV SOLN: 500.0000 mL | INTRAVENOUS | Status: DC

## 2013-03-30 NOTE — Progress Notes (Signed)
Stable to RR 

## 2013-03-30 NOTE — Op Note (Signed)
[name] [place], [id number]   COLONOSCOPY PROCEDURE REPORT  PATIENT: Ebony Knox, Ebony Knox  MR#: [id number] BIRTHDATE: 04/08/1940 , 72  yrs. old GENDER: Female ENDOSCOPIST: Jerene Bears, MD REFERRED PJ:ASNKN Aron, M.D. PROCEDURE DATE:  03/30/2013 PROCEDURE:   Colonoscopy with snare polypectomy First Screening Colonoscopy - Avg.  risk and is 50 yrs.  old or older Yes.  Prior Negative Screening - Now for repeat screening. N/A  History of Adenoma - Now for follow-up colonoscopy & has been > or = to 3 yrs.  N/A  Polyps Removed Today? Yes. ASA CLASS:   Class III INDICATIONS:average risk screening and first colonoscopy. MEDICATIONS: MAC sedation, administered by CRNA and propofol (Diprivan) 450mg  IV  DESCRIPTION OF PROCEDURE:   After the risks benefits and alternatives of the procedure were thoroughly explained, informed consent was obtained.  A digital rectal exam revealed no rectal mass.   The LB [id number]  endoscope was introduced through the anus and advanced to the terminal ileum which was intubated for a short distance. No adverse events experienced.   The quality of the prep was good, using MoviPrep  The instrument was then slowly withdrawn as the colon was fully examined.   COLON FINDINGS: The mucosa appeared normal in the terminal ileum. Two sessile polyps ranging between 3-69mm in size were found at the hepatic flexure and in the transverse colon.  Polypectomy was performed using cold snare.  All resections were complete and all polyp tissue was completely retrieved.   A single polyp measuring 8 mm in size was found in the rectum.  A polypectomy was performed using snare cautery.  The resection was complete and the polyp tissue was completely retrieved.  Retroflexed views revealed no abnormalities. The time to cecum=4 minutes 35 seconds.  Withdrawal time=18 minutes 43 seconds.  The scope was withdrawn and the procedure  completed.  COMPLICATIONS: There were no complications.    ENDOSCOPIC IMPRESSION: 1.   Normal mucosa in the terminal ileum 2.   Two sessile polyps ranging between 3-42mm in size were found at the hepatic flexure and in the transverse colon; Polypectomy was performed using cold snare 3.   Single polyp measuring 8 mm in size was found in the rectum; polypectomy was performed using snare cautery  RECOMMENDATIONS: 1.  Hold aspirin, aspirin products, and anti-inflammatory medication for 2 weeks. 2.  Await pathology results 3.  Timing of repeat colonoscopy will be determined by pathology findings. 4.  You will receive a letter within 1-2 weeks with the results of your biopsy as well as final recommendations.  Please call my office if you have not received a letter after 3 weeks.   eSigned:  Jerene Bears, MD 03/30/2013 11:17 AM   cc: The Patient and Arnette Norris MD   PATIENT NAME:  [name], [name] MR#: [id number]

## 2013-03-30 NOTE — Patient Instructions (Signed)
Discharge instructions given with verbal understanding. Handout on polyps. Resume previous medications. Hold aspirin and aspirin products for two weeks. YOU HAD AN ENDOSCOPIC PROCEDURE TODAY AT [facility]: Refer to the procedure report that was given to you for any specific questions about what was found during the examination.  If the procedure report does not answer your questions, please call your gastroenterologist to clarify.  If you requested that your care partner not be given the details of your procedure findings, then the procedure report has been included in a sealed envelope for you to review at your convenience later.  YOU SHOULD EXPECT: Some feelings of bloating in the abdomen. Passage of more gas than usual.  Walking can help get rid of the air that was put into your GI tract during the procedure and reduce the bloating. If you had a lower endoscopy (such as a colonoscopy or flexible sigmoidoscopy) you may notice spotting of blood in your stool or on the toilet paper. If you underwent a bowel prep for your procedure, then you may not have a normal bowel movement for a few days.  DIET: Your first meal following the procedure should be a light meal and then it is ok to progress to your normal diet.  A half-sandwich or bowl of soup is an example of a good first meal.  Heavy or fried foods are harder to digest and may make you feel nauseous or bloated.  Likewise meals heavy in dairy and vegetables can cause extra gas to form and this can also increase the bloating.  Drink plenty of fluids but you should avoid alcoholic beverages for 24 hours.  ACTIVITY: Your care partner should take you home directly after the procedure.  You should plan to take it easy, moving slowly for the rest of the day.  You can resume normal activity the day after the procedure however you should NOT DRIVE or use heavy machinery for 24 hours (because of the sedation medicines used during the test).     SYMPTOMS TO REPORT IMMEDIATELY: A gastroenterologist can be reached at any hour.  During normal business hours, 8:30 AM to 5:00 PM Monday through Friday, call [phone].  After hours and on weekends, please call the GI answering service at [phone] who will take a message and have the physician on call contact you.   Following lower endoscopy (colonoscopy or flexible sigmoidoscopy):  Excessive amounts of blood in the stool  Significant tenderness or worsening of abdominal pains  Swelling of the abdomen that is new, acute  Fever of 100F or higher  FOLLOW UP: If any biopsies were taken you will be contacted by phone or by letter within the next 1-3 weeks.  Call your gastroenterologist if you have not heard about the biopsies in 3 weeks.  Our staff will call the home number listed on your records the next business day following your procedure to check on you and address any questions or concerns that you may have at that time regarding the information given to you following your procedure. This is a courtesy call and so if there is no answer at the home number and we have not heard from you through the emergency physician on call, we will assume that you have returned to your regular daily activities without incident.  SIGNATURES/CONFIDENTIALITY: You and/or your care partner have signed paperwork which will be entered into your electronic medical record.  These signatures attest to the fact that that the information  above on your After Visit Summary has been reviewed and is understood.  Full responsibility of the confidentiality of this discharge information lies with you and/or your care-partner. 

## 2013-03-30 NOTE — Progress Notes (Signed)
Called to room to assist during endoscopic procedure.  Patient ID and intended procedure confirmed with present staff. Received instructions for my participation in the procedure from the performing physician.  

## 2013-03-31 ENCOUNTER — Telehealth: Payer: Self-pay | Admitting: *Deleted

## 2013-03-31 NOTE — Telephone Encounter (Signed)
  Follow up Call-  Call back number 03/30/2013  Post procedure Call Back phone  # [phone]  Permission to leave phone message Yes     Patient questions:  Do you have a fever, pain , or abdominal swelling? no Pain Score  0 *  Have you tolerated food without any problems? yes  Have you been able to return to your normal activities? yes  Do you have any questions about your discharge instructions: Diet   no Medications  no Follow up visit  no  Do you have questions or concerns about your Care? no  Actions: * If pain score is 4 or above: No action needed, pain <4.

## 2013-04-07 ENCOUNTER — Encounter: Payer: Self-pay | Admitting: Internal Medicine

## 2013-04-08 ENCOUNTER — Telehealth: Payer: Self-pay | Admitting: Internal Medicine

## 2013-04-08 NOTE — Telephone Encounter (Signed)
Pt had not received her path letter. Reviewed the letter with her as well as the path and informed her she can resume her NSAIDS. Mailed her another copy of the letter and path report. Pt stated understanding.

## 2013-04-21 ENCOUNTER — Telehealth: Payer: Self-pay | Admitting: Family Medicine

## 2013-04-21 ENCOUNTER — Ambulatory Visit (INDEPENDENT_AMBULATORY_CARE_PROVIDER_SITE_OTHER): Payer: Medicare Other | Admitting: Family Medicine

## 2013-04-21 ENCOUNTER — Encounter: Payer: Self-pay | Admitting: Family Medicine

## 2013-04-21 ENCOUNTER — Encounter: Payer: Self-pay | Admitting: *Deleted

## 2013-04-21 VITALS — BP 126/66 | HR 74 | Temp 97.6°F | Ht 68.25 in | Wt 214.2 lb

## 2013-04-21 DIAGNOSIS — R21 Rash and other nonspecific skin eruption: Secondary | ICD-10-CM

## 2013-04-21 DIAGNOSIS — I10 Essential (primary) hypertension: Secondary | ICD-10-CM

## 2013-04-21 MED ORDER — PRAVASTATIN SODIUM 40 MG PO TABS
ORAL_TABLET | ORAL | Status: DC
Start: 2013-04-21 — End: 2014-05-07

## 2013-04-21 MED ORDER — AMLODIPINE BESYLATE 5 MG PO TABS: 5.0000 mg | ORAL_TABLET | Freq: Every day | ORAL | Status: DC

## 2013-04-21 MED ORDER — TRIAMCINOLONE ACETONIDE 0.1 % EX CREA: 1.0000 "application " | TOPICAL_CREAM | Freq: Two times a day (BID) | CUTANEOUS | Status: AC

## 2013-04-21 MED ORDER — NYSTATIN-TRIAMCINOLONE 100000-0.1 UNIT/GM-% EX OINT: TOPICAL_OINTMENT | CUTANEOUS | Status: DC

## 2013-04-21 MED ORDER — FLUTICASONE PROPIONATE 50 MCG/ACT NA SUSP: 2.0000 | Freq: Every day | NASAL | Status: AC

## 2013-04-21 NOTE — Telephone Encounter (Signed)
Pt says the Nystatin-Triamcinolone ointment that Dr. Deborra Medina wrote an Rx for is $112 and pt says she can't afford to pay for it. She wants to know if there's an alternate/generic? Please call and advise pt. Thank you.

## 2013-04-21 NOTE — Patient Instructions (Signed)
Great to see you. We are starting mycolog- apply to rash twice daily x 10 days. Please call me in 10 days.

## 2013-04-21 NOTE — Progress Notes (Signed)
   Subjective:    Patient ID: Ebony Knox, female    DOB: [birth year], 73 y.o.   MRN: [id number]  HPI  Very pleasant 73 yo female here for rash on right ankle.  Has been there for over a month.  Very itchy.  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  No pets.  Never had anything like this before.  Gold bond seems to help.  Not weeping.  Patient Active Problem List   Diagnosis Date Noted  . Rash and nonspecific skin eruption 04/21/2013  . Encounter for Medicare annual wellness exam 11/05/2012  . Allergic rhinitis 08/20/2012  . HYPERLIPIDEMIA 05/15/2006  . HYPERTENSION, BENIGN SYSTEMIC 05/15/2006   Past Medical History  Diagnosis Date  . Hyperlipidemia   . Hypertension    No past surgical history on file. History  Substance Use Topics  . Smoking status: Never Smoker   . Smokeless tobacco: Never Used  . Alcohol Use: No   Family History  Problem Relation Age of Onset  . Heart disease Mother   . Heart disease Father   . Diabetes Brother   . Cancer Brother     colon  . Colon cancer Neg Hx   . Esophageal cancer Neg Hx   . Rectal cancer Neg Hx   . Stomach cancer Neg Hx    Allergies  Allergen Reactions  . Penicillins    Current Outpatient Prescriptions on File Prior to Visit  Medication Sig Dispense Refill  . aspirin 81 MG tablet Take 81 mg by mouth daily.        . Biotin 1000 MCG tablet Take 1,000 mcg by mouth daily.      . calcium carbonate (OS-CAL) 600 MG TABS Take 600 mg by mouth daily.      . cholecalciferol (VITAMIN D) 1000 UNITS tablet Take 1,000 Units by mouth daily.      . Multiple Vitamin (MULTIVITAMIN) capsule Take 1 capsule by mouth daily.        Marland Kitchen spironolactone-hydrochlorothiazide (ALDACTAZIDE) 25-25 MG per tablet Take 1 tablet by mouth daily.  30 tablet  5  . vitamin B-12 (CYANOCOBALAMIN) 1000 MCG tablet Take 1,000 mcg by mouth daily.         No current facility-administered medications on file prior to visit.   The PMH, PSH, Social History,  Family History, Medications, and allergies have been reviewed in [facility], and have been updated if relevant.   Review of Systems See HPI    Objective:   Physical Exam  Constitutional: She appears well-developed and well-nourished. No distress.  HENT:  Head: Normocephalic.  Skin: Skin is warm and dry. Rash noted. No cyanosis. Nails show no clubbing.      BP 126/66  Pulse 74  Temp(Src) 97.6 F (36.4 C) (Oral)  Ht 5' 8.25" (1.734 m)  Wt 214 lb 4 oz (97.183 kg)  BMI 32.32 kg/m2  SpO2 95%        Assessment & Plan:

## 2013-04-21 NOTE — Telephone Encounter (Signed)
Spoke to pt and informed her that a more cost effective medicine, Kenalog, has been sent to her pharmacy

## 2013-04-21 NOTE — Assessment & Plan Note (Signed)
New- ?ring worm although seems less likely based on physical characteristics and history vs eczema. Will treat with 10 day course of topical mycolog. If no improvement, refer to derm for biopsy and further tx. The patient indicates understanding of these issues and agrees with the plan.

## 2013-04-21 NOTE — Addendum Note (Signed)
Addended by: Lucille Passy on: 04/21/2013 12:25 PM   Modules accepted: Orders, Medications

## 2013-04-21 NOTE — Progress Notes (Signed)
Pre-visit discussion using our clinic review tool. No additional management support is needed unless otherwise documented below in the visit note.  

## 2013-04-23 ENCOUNTER — Telehealth: Payer: Self-pay | Admitting: Family Medicine

## 2013-04-23 NOTE — Telephone Encounter (Signed)
Relevant patient education assigned to patient using Emmi. ° °

## 2013-05-31 ENCOUNTER — Telehealth: Payer: Self-pay

## 2013-05-31 DIAGNOSIS — M722 Plantar fascial fibromatosis: Secondary | ICD-10-CM

## 2013-05-31 NOTE — Telephone Encounter (Signed)
Pt left v/m; pt said each morning pt's feet are tender, during the day tenderness is gone; pt has been wearing flat sneekers and walks about 30 mins daily; pt wants to know if Dr Deborra Medina would need to see pt or should she see foot specialist since daily issue.Please advise.

## 2013-05-31 NOTE — Telephone Encounter (Signed)
Spoke to pt and informed her that a referral has been placed and to await a call from Sylva with appt details

## 2013-05-31 NOTE — Telephone Encounter (Signed)
It sounds a lot like plantar fascitis.  I can certainly refer her to a podiatrist.  Referral placed.

## 2013-06-23 ENCOUNTER — Telehealth: Payer: Self-pay | Admitting: Family Medicine

## 2013-06-23 NOTE — Telephone Encounter (Addendum)
Pt left v/m; pt wants to make sure pt gets 5 refills on spironolactone.[facility]. Pt request cb.

## 2013-08-04 ENCOUNTER — Telehealth: Payer: Self-pay

## 2013-08-04 NOTE — Telephone Encounter (Signed)
Pt request PPD done by next week for pt to have reading for potential job placement at [employer]. Pt scheduled for 08/10/13 at 3:30 pm. Pt will ck with ins co to see if will pay for skin test.

## 2013-08-10 ENCOUNTER — Ambulatory Visit (INDEPENDENT_AMBULATORY_CARE_PROVIDER_SITE_OTHER): Payer: Medicare Other

## 2013-08-10 DIAGNOSIS — Z111 Encounter for screening for respiratory tuberculosis: Secondary | ICD-10-CM

## 2013-08-12 LAB — TB SKIN TEST: TB Skin Test: NEGATIVE

## 2013-10-26 ENCOUNTER — Other Ambulatory Visit: Payer: Self-pay | Admitting: Family Medicine

## 2013-11-08 ENCOUNTER — Encounter: Payer: Self-pay | Admitting: Family Medicine

## 2013-11-08 ENCOUNTER — Telehealth: Payer: Self-pay | Admitting: Family Medicine

## 2013-11-08 ENCOUNTER — Encounter (INDEPENDENT_AMBULATORY_CARE_PROVIDER_SITE_OTHER): Payer: Self-pay

## 2013-11-08 ENCOUNTER — Ambulatory Visit (INDEPENDENT_AMBULATORY_CARE_PROVIDER_SITE_OTHER): Payer: Medicare Other | Admitting: Family Medicine

## 2013-11-08 VITALS — BP 122/70 | HR 72 | Temp 97.6°F | Ht 69.0 in | Wt 210.5 lb

## 2013-11-08 DIAGNOSIS — R300 Dysuria: Secondary | ICD-10-CM

## 2013-11-08 DIAGNOSIS — I10 Essential (primary) hypertension: Secondary | ICD-10-CM

## 2013-11-08 DIAGNOSIS — Z0000 Encounter for general adult medical examination without abnormal findings: Secondary | ICD-10-CM

## 2013-11-08 DIAGNOSIS — E785 Hyperlipidemia, unspecified: Secondary | ICD-10-CM

## 2013-11-08 DIAGNOSIS — R10819 Abdominal tenderness, unspecified site: Secondary | ICD-10-CM | POA: Insufficient documentation

## 2013-11-08 LAB — POCT URINALYSIS DIPSTICK
BILIRUBIN UA: NEGATIVE
Glucose, UA: NEGATIVE
Ketones, UA: NEGATIVE
LEUKOCYTES UA: NEGATIVE
Nitrite, UA: NEGATIVE
PH UA: 6.5
Protein, UA: NEGATIVE
RBC UA: NEGATIVE
Spec Grav, UA: 1.015
Urobilinogen, UA: 0.2

## 2013-11-08 LAB — LIPID PANEL
CHOL/HDL RATIO: 4
Cholesterol: 206 mg/dL — ABNORMAL HIGH (ref 0–200)
HDL: 54.4 mg/dL (ref >39.00)
LDL Cholesterol: 136 mg/dL — ABNORMAL HIGH (ref 0–99)
NonHDL: 151.6
TRIGLYCERIDES: 76 mg/dL (ref 0.0–149.0)
VLDL: 15.2 mg/dL (ref 0.0–40.0)

## 2013-11-08 LAB — COMPREHENSIVE METABOLIC PANEL
ALT: 16 U/L (ref 0–35)
AST: 19 U/L (ref 0–37)
Albumin: 4 g/dL (ref 3.5–5.2)
Alkaline Phosphatase: 67 U/L (ref 39–117)
BUN: 15 mg/dL (ref 6–23)
CALCIUM: 9.6 mg/dL (ref 8.4–10.5)
CO2: 29 meq/L (ref 19–32)
Chloride: 97 mEq/L (ref 96–112)
Creatinine, Ser: 0.9 mg/dL (ref 0.4–1.2)
GFR: 82.11 mL/min (ref >60.00)
Glucose, Bld: 94 mg/dL (ref 70–99)
Potassium: 3.5 mEq/L (ref 3.5–5.1)
SODIUM: 135 meq/L (ref 135–145)
Total Bilirubin: 0.7 mg/dL (ref 0.2–1.2)
Total Protein: 7.9 g/dL (ref 6.0–8.3)

## 2013-11-08 LAB — CBC WITH DIFFERENTIAL/PLATELET
BASOS ABS: 0 10*3/uL (ref 0.0–0.1)
Basophils Relative: 0.4 % (ref 0.0–3.0)
EOS PCT: 1.1 % (ref 0.0–5.0)
Eosinophils Absolute: 0.1 10*3/uL (ref 0.0–0.7)
HCT: 39.4 % (ref 36.0–46.0)
Hemoglobin: 13.5 g/dL (ref 12.0–15.0)
Lymphocytes Relative: 24.7 % (ref 12.0–46.0)
Lymphs Abs: 2.4 10*3/uL (ref 0.7–4.0)
MCHC: 34.3 g/dL (ref 30.0–36.0)
MCV: 92.3 fl (ref 78.0–100.0)
MONOS PCT: 7.8 % (ref 3.0–12.0)
Monocytes Absolute: 0.7 10*3/uL (ref 0.1–1.0)
NEUTROS PCT: 66 % (ref 43.0–77.0)
Neutro Abs: 6.3 10*3/uL (ref 1.4–7.7)
PLATELETS: 291 10*3/uL (ref 150.0–400.0)
RBC: 4.27 Mil/uL (ref 3.87–5.11)
RDW: 13.2 % (ref 11.5–15.5)
WBC: 9.5 10*3/uL (ref 4.0–10.5)

## 2013-11-08 LAB — TSH: TSH: 1.11 u[IU]/mL (ref 0.35–4.50)

## 2013-11-08 NOTE — Progress Notes (Signed)
[name] is a very pleasant 73 year old female with h/o HTN, OA,  and HLD here for annual medicare wellness visit.  I have personally reviewed the Medicare Annual Wellness questionnaire and have noted 1. The patient's medical and social history 2. Their use of alcohol, tobacco or illicit drugs 3. Their current medications and supplements 4. The patient's functional ability including ADL's, fall risks, home safety risks and hearing or visual             impairment. 5. Diet and physical activities 6. Evidence for depression or mood disorders  Td 07/18/06 Has previously refused pneumovax, influenza, and zoster vaccinations. Colonoscopy 03/30/13 ([facility])- 10 year recall Mammogram 01/05/13  LMP over 20 years ago.  No h/o post menopausal bleeding.  End of life wishes discussed and updated in Social History.  The roster of all physicians providing medical care to patient - is listed in the Snapshot section of the chart.  Doing very well- house is on the market.  Wants to move back to [place] to be closer to her 5 grand children and 11 great grand children.  1. Hyperlipidemia- due for labs.   Still taking pravachol 40 mg nightly.  Lab Results  Component Value Date   CHOL 206* 02/05/2013   HDL 48.90 02/05/2013   LDLCALC 114* 01/01/2012   LDLDIRECT 142.4 02/05/2013   TRIG 130.0 02/05/2013   CHOLHDL 4 02/05/2013      Wt Readings from Last 3 Encounters:  11/08/13 210 lb 8 oz (95.482 kg)  04/21/13 214 lb 4 oz (97.183 kg)  03/30/13 218 lb (98.884 kg)     2.  HTN- controlled.  Has been compliant with meds, Aldactazide 25-25 and Amlodipine 5 mg,  and is walking daily.  No CP.HA or visual changes.  Lab Results  Component Value Date   CREATININE 0.7 11/02/2012    3.  Suprapubic tenderness- started last week.  Drank cranberry juice and symptoms resolved.  No fevers, chills, no hematuria, no dysuria or increased urinary frequency.  Patient Active Problem List   Diagnosis Date  Noted  . Encounter for Medicare annual wellness exam 11/05/2012  . Allergic rhinitis 08/20/2012  . HYPERLIPIDEMIA 05/15/2006  . HYPERTENSION, BENIGN SYSTEMIC 05/15/2006    History  Substance Use Topics  . Smoking status: Never Smoker   . Smokeless tobacco: Never Used  . Alcohol Use: No   Family History  Problem Relation Age of Onset  . Heart disease Mother   . Heart disease Father   . Diabetes Brother   . Cancer Brother     colon  . Colon cancer Neg Hx   . Esophageal cancer Neg Hx   . Rectal cancer Neg Hx   . Stomach cancer Neg Hx    Allergies  Allergen Reactions  . Penicillins    Current Outpatient Prescriptions on File Prior to Visit  Medication Sig Dispense Refill  . amLODipine (NORVASC) 5 MG tablet Take 1 tablet (5 mg total) by mouth daily.  30 tablet  5  . aspirin 81 MG tablet Take 81 mg by mouth daily.        . Biotin 1000 MCG tablet Take 1,000 mcg by mouth daily.      . calcium carbonate (OS-CAL) 600 MG TABS Take 600 mg by mouth daily.      . cholecalciferol (VITAMIN D) 1000 UNITS tablet Take 1,000 Units by mouth daily.      . fluticasone (FLONASE) 50 MCG/ACT nasal spray Place 2 sprays  into both nostrils daily.  16 g  6  . Multiple Vitamin (MULTIVITAMIN) capsule Take 1 capsule by mouth daily.        . pravastatin (PRAVACHOL) 40 MG tablet TAKE 1 TABLET (40 MG TOTAL) BY MOUTH AT BEDTIME.  30 tablet  5  . spironolactone-hydrochlorothiazide (ALDACTAZIDE) 25-25 MG per tablet TAKE 1 TABLET BY MOUTH DAILY.  30 tablet  5  . triamcinolone cream (KENALOG) 0.1 % Apply 1 application topically 2 (two) times daily.  30 g  0  . vitamin B-12 (CYANOCOBALAMIN) 1000 MCG tablet Take 1,000 mcg by mouth daily.         No current facility-administered medications on file prior to visit.   The PMH, PSH, Social History, Family History, Medications, and allergies have been reviewed in [facility], and have been updated if relevant.   Review of Systems       See HPI Patient reports no  vision/  hearing changes,anorexia, weight change, fever ,adenopathy, persistant / recurrent hoarseness, swallowing issues, chest pain, edema,persistant / recurrent cough, hemoptysis, dyspnea(rest, exertional, paroxysmal nocturnal), gastrointestinal  bleeding (melena, rectal bleeding), abdominal pain, excessive heart burn, GU symptoms(dysuria, hematuria, pyuria, voiding/incontinence  Issues) syncope, focal weakness, severe memory loss, concerning skin lesions, depression, anxiety, abnormal bruising/bleeding, major joint swelling, breast masses or abnormal vaginal bleeding.     Physical Exam BP 122/70  Pulse 72  Temp(Src) 97.6 F (36.4 C) (Oral)  Ht 5\' 9"  (1.753 m)  Wt 210 lb 8 oz (95.482 kg)  BMI 31.07 kg/m2  SpO2 94%  General:  Well-developed,well-nourished,in no acute distress; alert,appropriate and cooperative throughout examination Head:  normocephalic and atraumatic.   Eyes:  vision grossly intact, pupils equal, pupils round, and pupils reactive to light.   Ears:  R ear normal and L ear normal.   Nose:  no external deformity.   Mouth:  good dentition.   Neck:  No deformities, masses, or tenderness noted. Breasts:  No mass, nodules, thickening, tenderness, bulging, retraction, inflamation, nipple discharge or skin changes noted.   Lungs:  Normal respiratory effort, chest expands symmetrically. Lungs are clear to auscultation, no crackles or wheezes. Heart:  Normal rate and regular rhythm. S1 and S2 normal without gallop, murmur, click, rub or other extra sounds. Abdomen:  Bowel sounds positive,abdomen soft and non-tender without masses, organomegaly or hernias noted. Msk:  No deformity or scoliosis noted of thoracic or lumbar spine.   Extremities:  No clubbing, cyanosis, edema, or deformity noted with normal full range of motion of all joints.   Neurologic:  alert & oriented X3 and gait normal.   Skin:  Intact without suspicious lesions or rashes Cervical Nodes:  No lymphadenopathy  noted Axillary Nodes:  No palpable lymphadenopathy Psych:  Cognition and judgment appear intact. Alert and cooperative with normal attention span and concentration. No apparent delusions, illusions, hallucinations

## 2013-11-08 NOTE — Progress Notes (Signed)
Pre visit review using our clinic review tool, if applicable. No additional management support is needed unless otherwise documented below in the visit note. 

## 2013-11-08 NOTE — Assessment & Plan Note (Signed)
The patients weight, height, BMI and visual acuity have been recorded in the chart I have made referrals, counseling and provided education to the patient based review of the above and I have provided the pt with a written personalized care plan for preventive services.  Declines immunizations.  Orders Placed This Encounter  Procedures  . CBC with Differential  . Comprehensive metabolic panel  . Lipid panel  . TSH  . Urinalysis Dipstick

## 2013-11-08 NOTE — Telephone Encounter (Signed)
Pt stopped by my desk after cpe wanting to know if it was ok to take the ibuprofen for her plantar fasciitis in addition with her BP and cholesterol medicine? Please advise

## 2013-11-08 NOTE — Telephone Encounter (Signed)
Yes ok to take not to exceed 800 mg three times daily.    Take with food and for short periods of time.

## 2013-11-08 NOTE — Telephone Encounter (Signed)
Lm on pts vm and advised per Dr Deborra Medina. Pt instructed to contact office should she have additional questions

## 2013-11-08 NOTE — Assessment & Plan Note (Signed)
Well controlled on current rx. No changes made today. 

## 2013-11-08 NOTE — Patient Instructions (Signed)
Wonderful to see you. I will call you with your lab results. Good luck with your move and please keep in touch.

## 2013-11-08 NOTE — Assessment & Plan Note (Addendum)
Resolved. UA neg. Call or return to clinic prn if these symptoms worsen or fail to improve as anticipated.

## 2013-11-09 ENCOUNTER — Encounter: Payer: Self-pay | Admitting: *Deleted

## 2014-01-18 ENCOUNTER — Other Ambulatory Visit: Payer: Self-pay | Admitting: *Deleted

## 2014-01-18 DIAGNOSIS — I10 Essential (primary) hypertension: Secondary | ICD-10-CM

## 2014-01-18 MED ORDER — AMLODIPINE BESYLATE 5 MG PO TABS: 5.0000 mg | ORAL_TABLET | Freq: Every day | ORAL | Status: DC

## 2014-03-25 ENCOUNTER — Telehealth: Payer: Self-pay

## 2014-03-25 NOTE — Telephone Encounter (Signed)
Pt is aware as instructed 

## 2014-03-25 NOTE — Telephone Encounter (Signed)
PLEASE NOTE: All timestamps contained within this report are represented as [place] Standard Time. CONFIDENTIALTY NOTICE: This fax transmission is intended only for the addressee. It contains information that is legally privileged, confidential or otherwise protected from use or disclosure. If you are not the intended recipient, you are strictly prohibited from reviewing, disclosing, copying using or disseminating any of this information or taking any action in reliance on or regarding this information. If you have received this fax in error, please notify us immediately by telephone so that we can arrange for its return to [place]. Phone: [phone], Toll-Free: [phone], Fax: [phone] Page: 1 of 1 Call Id: [id number] Derma Patient Name: Ebony Knox Gender: Unknown DOB: (approximate) Age: Return Phone Number: Address: City/State/Zip: [place] Client [place] [facility] - Client Client Site [place] - Day Physician Viviana Simpler Contact Type Call Caller Name [place] Phone Number [phone] Relationship To Patient Self Is this call to report lab results? No Call Type General Information Initial Comment Caller States he is needing to make an appt for as early next week as possible. pls call to make the appt. General Information Type Message Only Nurse Assessment Guidelines Guideline Title Affirmed Question Affirmed Notes Nurse Date/Time (Eastern Time) Disp. Time Eilene Ghazi Time) Disposition Final User 03/25/2014 8:21:01 AM General Information Provided Yes Donato Heinz After Care Instructions Given Call Event Type User Date / Time Description

## 2014-03-25 NOTE — Telephone Encounter (Signed)
Pt has appt with Webb Silversmith NP on 03/28/14.

## 2014-03-25 NOTE — Telephone Encounter (Signed)
°  [facility]  Patient Name: Ebony Knox  Gender: Female  DOB: 1940-04-02   Age: 74 Y 2 M 26 D  Return Phone Number: [phone] (Primary)  Address:   [place] [address]   Client [facility]  Client Site [facility]  Physician Arnette Norris   Contact Type Call  Call Type Triage / Clinical     Relationship To Patient Self  Appointment Disposition EMR Appointment Not Necessary  Return Phone Number [phone] (Primary)  Chief Complaint Cold Symptom  Initial Comment Caller states has a slight cold, wants to know if she can take robitussin DM   Tok Not Listed refused triage         Nurse Assessment  Nurse: Shawn Stall, RN, Trish Date/Time (Eastern Time): 03/25/2014 9:08:55 AM  Confirm and document reason for call. If symptomatic, describe symptoms. ---Patient is Caller states has a slight cold started last Tuesday, wants to know if she can take robitussin DM. Patient is using lemon and tea. No temperature. Cough seems to be more at night. Patient has a nasal spray the the dr. prescribed and she has been using that.  Has the patient traveled out of the country within the last 30 days? ---No  Does the patient require triage? ---Declined Triage  Please document clinical information provided and list any resource used. ---Patient is not comfortable with giving me information to triage her. She is not comfortable that I asked for her to verify her name and birth date and is not comfortable giving health information over the phone.     Guidelines      Guideline Title Affirmed Question Affirmed Notes Nurse Date/Time (Eastern Time)         Disp. Time Eilene Ghazi Time) Disposition Final User   03/25/2014 9:18:01 AM Send To RN Personal  Mallie Mussel, RN, Alveta Heimlich    03/25/2014 9:20:37 AM Clinical Call Yes Shawn Stall, RN, Trish        After Care Instructions Given     Call Event Type User Date / Time Description

## 2014-03-25 NOTE — Telephone Encounter (Signed)
OK for her to take robitussin DM until appt

## 2014-03-27 ENCOUNTER — Encounter (HOSPITAL_COMMUNITY): Payer: Self-pay

## 2014-03-27 ENCOUNTER — Emergency Department (HOSPITAL_COMMUNITY): Payer: Medicare Other

## 2014-03-27 ENCOUNTER — Emergency Department (HOSPITAL_COMMUNITY)
Admission: EM | Admit: 2014-03-27 | Discharge: 2014-03-27 | Disposition: A | Discharge status: Home / Self Care | Payer: Medicare Other | Attending: Emergency Medicine | Admitting: Emergency Medicine

## 2014-03-27 DIAGNOSIS — Z7952 Long term (current) use of systemic steroids: Secondary | ICD-10-CM | POA: Diagnosis not present

## 2014-03-27 DIAGNOSIS — R059 Cough, unspecified: Secondary | ICD-10-CM

## 2014-03-27 DIAGNOSIS — J8410 Pulmonary fibrosis, unspecified: Secondary | ICD-10-CM | POA: Diagnosis not present

## 2014-03-27 DIAGNOSIS — E785 Hyperlipidemia, unspecified: Secondary | ICD-10-CM | POA: Diagnosis not present

## 2014-03-27 DIAGNOSIS — R05 Cough: Secondary | ICD-10-CM

## 2014-03-27 DIAGNOSIS — Z880 Allergy status to penicillin: Secondary | ICD-10-CM | POA: Insufficient documentation

## 2014-03-27 DIAGNOSIS — Z7951 Long term (current) use of inhaled steroids: Secondary | ICD-10-CM | POA: Insufficient documentation

## 2014-03-27 DIAGNOSIS — Z7982 Long term (current) use of aspirin: Secondary | ICD-10-CM | POA: Diagnosis not present

## 2014-03-27 DIAGNOSIS — J069 Acute upper respiratory infection, unspecified: Secondary | ICD-10-CM

## 2014-03-27 DIAGNOSIS — I10 Essential (primary) hypertension: Secondary | ICD-10-CM | POA: Diagnosis not present

## 2014-03-27 DIAGNOSIS — Z79899 Other long term (current) drug therapy: Secondary | ICD-10-CM | POA: Insufficient documentation

## 2014-03-27 DIAGNOSIS — R058 Other specified cough: Secondary | ICD-10-CM

## 2014-03-27 MED ORDER — PROMETHAZINE-CODEINE 6.25-10 MG/5ML PO SYRP: 5.0000 mL | ORAL_SOLUTION | Freq: Four times a day (QID) | ORAL | Status: DC | PRN

## 2014-03-27 NOTE — ED Notes (Signed)
Per pt, started having cold symptoms on Sunday.  Cough/runny nose/sneezing.  Notified primary. Cannot get into MD until Tuesday.  Taking home meds with no relief.  Pt not sleeping d/t cough.  Denies fever.

## 2014-03-27 NOTE — Discharge Instructions (Signed)
Take the prescribed medication as directed-- use with caution it may make you a little drowsy. Follow-up with your primary care physician on Tuesday as scheduled. Return to the ED for new or worsening symptoms.

## 2014-03-27 NOTE — ED Provider Notes (Signed)
CSN: [id number]     Arrival date & time 03/27/14  1006 History   First MD Initiated Contact with Patient 03/27/14 1035     Chief Complaint  Patient presents with  . Cough  . Nasal Congestion     (Consider location/radiation/quality/duration/timing/severity/associated sxs/prior Treatment) Patient is a 74 y.o. female presenting with cough. The history is provided by the patient and medical records.  Cough Associated symptoms: rhinorrhea     This is a 74 y.o. F with PMH significant for HTN and HLP, presenting to the ED for URI type symptoms for the past week.  Patient states she came to visit a friend in the hospital last week, began feeling bad after that.  Specifically patient has had non-productive cough, rhinorrhea, sneezing, and sinus pressure.  Patient states cough seems to keep her up at night.  Denies chest pain, SOB, palpitations, dizziness, weakness, fever, chills, sweats.  Has been using home remedies and OTC meds without relief.  VS stable on arrival.  Past Medical History  Diagnosis Date  . Hyperlipidemia   . Hypertension    History reviewed. No pertinent past surgical history. Family History  Problem Relation Age of Onset  . Heart disease Mother   . Heart disease Father   . Diabetes Brother   . Cancer Brother     colon  . Colon cancer Neg Hx   . Esophageal cancer Neg Hx   . Rectal cancer Neg Hx   . Stomach cancer Neg Hx    History  Substance Use Topics  . Smoking status: Never Smoker   . Smokeless tobacco: Never Used  . Alcohol Use: No   OB History    No data available     Review of Systems  HENT: Positive for congestion, rhinorrhea, sinus pressure and sneezing.   Respiratory: Positive for cough.   All other systems reviewed and are negative.     Allergies  Penicillins  Home Medications   Prior to Admission medications   Medication Sig Start Date End Date Taking? Authorizing Provider  amLODipine (NORVASC) 5 MG tablet Take 1 tablet (5 mg total) by  mouth daily. 01/18/14   Lucille Passy, MD  aspirin 81 MG tablet Take 81 mg by mouth daily.      Historical Provider, MD  Biotin 1000 MCG tablet Take 1,000 mcg by mouth daily.    Historical Provider, MD  calcium carbonate (OS-CAL) 600 MG TABS Take 600 mg by mouth daily.    Historical Provider, MD  cholecalciferol (VITAMIN D) 1000 UNITS tablet Take 1,000 Units by mouth daily.    Historical Provider, MD  fluticasone (FLONASE) 50 MCG/ACT nasal spray Place 2 sprays into both nostrils daily. 04/21/13   Lucille Passy, MD  Multiple Vitamin (MULTIVITAMIN) capsule Take 1 capsule by mouth daily.      Historical Provider, MD  pravastatin (PRAVACHOL) 40 MG tablet TAKE 1 TABLET (40 MG TOTAL) BY MOUTH AT BEDTIME. 04/21/13   Lucille Passy, MD  spironolactone-hydrochlorothiazide (ALDACTAZIDE) 25-25 MG per tablet TAKE 1 TABLET BY MOUTH DAILY. 10/26/13   Lucille Passy, MD  triamcinolone cream (KENALOG) 0.1 % Apply 1 application topically 2 (two) times daily. 04/21/13   Lucille Passy, MD  vitamin B-12 (CYANOCOBALAMIN) 1000 MCG tablet Take 1,000 mcg by mouth daily.      Historical Provider, MD   BP 143/71 mmHg  Pulse 73  Temp(Src) 98.3 F (36.8 C) (Oral)  Resp 18  SpO2 98%   Physical Exam  Constitutional:  She is oriented to person, place, and time. She appears well-developed and well-nourished. No distress.  HENT:  Head: Normocephalic and atraumatic.  Right Ear: Tympanic membrane and ear canal normal.  Left Ear: Tympanic membrane and ear canal normal.  Nose: Mucosal edema and rhinorrhea present.  Mouth/Throat: Uvula is midline, oropharynx is clear and moist and mucous membranes are normal. No oropharyngeal exudate, posterior oropharyngeal edema, posterior oropharyngeal erythema or tonsillar abscesses.  PND noted  Eyes: Conjunctivae and EOM are normal. Pupils are equal, round, and reactive to light.  Neck: Normal range of motion. Neck supple.  Cardiovascular: Normal rate, regular rhythm and normal heart sounds.    Pulmonary/Chest: Effort normal and breath sounds normal. No respiratory distress. She has no decreased breath sounds. She has no wheezes. She has no rhonchi. She has no rales.  Respirations unlabored, lungs clear bilaterally, speaking in full complete sentences without difficulty  Musculoskeletal: Normal range of motion.  Neurological: She is alert and oriented to person, place, and time.  Skin: Skin is warm and dry. She is not diaphoretic.  Psychiatric: She has a normal mood and affect.  Nursing note and vitals reviewed.   ED Course  Procedures (including critical care time) Labs Review Labs Reviewed - No data to display  Imaging Review Dg Chest 2 View  03/27/2014   CLINICAL DATA:  Five day history of cough and congestion  EXAM: CHEST  2 VIEW  COMPARISON:  April 27, 2006  FINDINGS: There is a tiny granuloma in the left base. Elsewhere lungs are clear. Heart size and pulmonary vascularity are normal. No adenopathy. No bone lesions.  IMPRESSION: Tiny granuloma left base.  No edema or consolidation.   Electronically Signed   By: Lowella Grip M.D.   On: 03/27/2014 11:12     EKG Interpretation None      MDM   Final diagnoses:  URI (upper respiratory infection)  Dry cough   74 year old female with URI symptoms for the past week after visiting friend in hospital. Notably patient has nasal congestion and dry cough.  On exam, patient afebrile and nontoxic in appearance. Her exam is overall noninfectious. Her lungs are clear bilaterally and vital signs are stable on room air. Chest x-ray was obtained revealing tiny granuloma left lung base, this is new from last chest x-ray in 2008.  At this time, suspect her symptoms are largely viral in nature.  Patient requests stronger cough medication, was written for phenergan/codeine syrup-- advised to use with caution as may cause some drowsiness.  She has previously scheduled FU with her PCP on Tuesday (2 days)-- recommended to discuss CXR  findings to make sure this is properly followed.  Patient was given hard copy of CXR and report to discuss with PCP.  Discussed plan with patient, he/she acknowledged understanding and agreed with plan of care.  Return precautions given for new or worsening symptoms.  Case discussed with attending physician, Dr. Tamera Punt, who agrees with assessment and plan of care.  Larene Pickett, PA-C 03/27/14 [place], MD 03/27/14 [phone]

## 2014-03-28 ENCOUNTER — Ambulatory Visit: Payer: Self-pay | Admitting: Internal Medicine

## 2014-03-28 ENCOUNTER — Telehealth: Payer: Self-pay | Admitting: Family Medicine

## 2014-03-28 NOTE — Telephone Encounter (Signed)
Patient did not come for their scheduled appointment today cough /running nose  Please let me know if the patient needs to be contacted immediately for follow up or if no follow up is necessary.

## 2014-03-28 NOTE — Telephone Encounter (Signed)
No follow up needed

## 2014-03-30 ENCOUNTER — Encounter: Payer: Self-pay | Admitting: Family Medicine

## 2014-03-30 ENCOUNTER — Ambulatory Visit (INDEPENDENT_AMBULATORY_CARE_PROVIDER_SITE_OTHER): Payer: Medicare Other | Admitting: Family Medicine

## 2014-03-30 VITALS — BP 130/84 | HR 80 | Temp 98.3°F | Wt 212.2 lb

## 2014-03-30 DIAGNOSIS — J209 Acute bronchitis, unspecified: Secondary | ICD-10-CM | POA: Insufficient documentation

## 2014-03-30 MED ORDER — AZITHROMYCIN 250 MG PO TABS
ORAL_TABLET | ORAL | Status: DC
Start: 2014-03-30 — End: 2014-04-14

## 2014-03-30 MED ORDER — HYDROCODONE-HOMATROPINE 5-1.5 MG/5ML PO SYRP: 5.0000 mL | ORAL_SOLUTION | ORAL | Status: DC | PRN

## 2014-03-30 NOTE — Progress Notes (Signed)
Subjective:   Patient ID: Ebony Knox, female    DOB: [birth year], 74 y.o.   MRN: [id number]  Ebony Knox is a pleasant 74 y.o. year old female who presents to clinic today with Cough and Hospitalization Follow-up  on 03/30/2014  HPI:  Ebony Knox to [facility] on 03/27/14 for progressive URI symptoms x 1 week. Note reviewed-  CXR neg. Dg Chest 2 View  03/27/2014   CLINICAL DATA:  Five day history of cough and congestion  EXAM: CHEST  2 VIEW  COMPARISON:  April 27, 2006  FINDINGS: There is a tiny granuloma in the left base. Elsewhere lungs are clear. Heart size and pulmonary vascularity are normal. No adenopathy. No bone lesions.  IMPRESSION: Tiny granuloma left base.  No edema or consolidation.   Electronically Signed   By: Lowella Grip M.D.   On: 03/27/2014 11:12   Advised likely viral- given rx for promethazine with codeine.  Here today because symptoms are progressing.  Cough suppressant not helping at all with cough - staying up all night coughing. Cough is worse- feels like she is wheezing and more SOB. Eyes are red this am as well.  No nausea or vomiting.  Not taking anything OTC currently, was taking Robitussin.  Current Outpatient Prescriptions on File Prior to Visit  Medication Sig Dispense Refill  . amLODipine (NORVASC) 5 MG tablet Take 1 tablet (5 mg total) by mouth daily. 30 tablet 5  . aspirin 81 MG tablet Take 81 mg by mouth daily.      . Biotin 1000 MCG tablet Take 1,000 mcg by mouth daily.    . calcium carbonate (OS-CAL) 600 MG TABS Take 600 mg by mouth daily.    . cholecalciferol (VITAMIN D) 1000 UNITS tablet Take 1,000 Units by mouth daily.    . fluticasone (FLONASE) 50 MCG/ACT nasal spray Place 2 sprays into both nostrils daily. 16 g 6  . Multiple Vitamin (MULTIVITAMIN) capsule Take 1 capsule by mouth daily.      . pravastatin (PRAVACHOL) 40 MG tablet TAKE 1 TABLET (40 MG TOTAL) BY MOUTH AT BEDTIME. 30 tablet 5  . spironolactone-hydrochlorothiazide  (ALDACTAZIDE) 25-25 MG per tablet TAKE 1 TABLET BY MOUTH DAILY. 30 tablet 5  . triamcinolone cream (KENALOG) 0.1 % Apply 1 application topically 2 (two) times daily. 30 g 0  . vitamin B-12 (CYANOCOBALAMIN) 1000 MCG tablet Take 1,000 mcg by mouth daily.       No current facility-administered medications on file prior to visit.    Allergies  Allergen Reactions  . Penicillins     Past Medical History  Diagnosis Date  . Hyperlipidemia   . Hypertension     No past surgical history on file.  Family History  Problem Relation Age of Onset  . Heart disease Mother   . Heart disease Father   . Diabetes Brother   . Cancer Brother     colon  . Colon cancer Neg Hx   . Esophageal cancer Neg Hx   . Rectal cancer Neg Hx   . Stomach cancer Neg Hx     History   Social History  . Marital Status: Divorced    Spouse Name: N/A    Number of Children: 0  . Years of Education: N/A   Occupational History  . Retired [employer]   Social History Main Topics  . Smoking status: Never Smoker   . Smokeless tobacco: Never Used  . Alcohol Use: No  . Drug Use: No  .  Sexual Activity: Not on file   Other Topics Concern  . Not on file   Social History Narrative   Does not have a living will.   Desires CPR.  Would accept life support but not for prolonged period of time.   The PMH, PSH, Social History, Family History, Medications, and allergies have been reviewed in [facility], and have been updated if relevant.    Review of Systems  Constitutional: Positive for fever and fatigue.  HENT: Positive for congestion, ear pain, postnasal drip, rhinorrhea, sinus pressure and sore throat. Negative for trouble swallowing and voice change.   Eyes: Positive for redness. Negative for visual disturbance.  Respiratory: Positive for cough, shortness of breath and wheezing. Negative for chest tightness.   Cardiovascular: Negative.   Gastrointestinal: Negative.   Endocrine: Negative.   Genitourinary:  Negative.   Musculoskeletal: Negative.   Skin: Negative.   Allergic/Immunologic: Negative.   Neurological: Negative.   Hematological: Negative.   Psychiatric/Behavioral: Negative.        Objective:    BP 130/84 mmHg  Pulse 80  Temp(Src) 98.3 F (36.8 C) (Oral)  Wt 212 lb 4 oz (96.276 kg)  SpO2 97%   Physical Exam  Constitutional: She is oriented to person, place, and time. She appears well-developed and well-nourished. No distress.  HENT:  Head: Normocephalic.  Right Ear: Hearing normal. Tympanic membrane is injected.  Left Ear: Hearing normal. Tympanic membrane is injected.  Nose: Rhinorrhea present. No sinus tenderness.  Mouth/Throat: Mucous membranes are normal. Posterior oropharyngeal erythema present. No oropharyngeal exudate, posterior oropharyngeal edema or tonsillar abscesses.  Eyes: EOM are normal. Right conjunctiva is injected. Left conjunctiva is injected. No scleral icterus.  Cardiovascular: Normal rate and regular rhythm.   Pulmonary/Chest: Effort normal. She has wheezes. She has rales.  Abdominal: Soft.  Musculoskeletal: Normal range of motion.  Neurological: She is alert and oriented to person, place, and time. No cranial nerve deficit.  Skin: Skin is warm and dry.  Psychiatric: She has a normal mood and affect. Her behavior is normal. Judgment and thought content normal.          Assessment & Plan:   Acute bronchitis, unspecified organism No Follow-up on file.

## 2014-03-30 NOTE — Assessment & Plan Note (Signed)
Deteriorated. Given duration and progression of symptoms, will treat for bacterial process with Zpack.  D/c promethazine cough suppressant as not effective. Add mucinex dm- push fluids. Given rx for hycodan (tussionex likely more effective but cough prohibitive) to use prn qhs cough. Call or return to clinic prn if these symptoms worsen or fail to improve as anticipated. The patient indicates understanding of these issues and agrees with the plan.

## 2014-03-30 NOTE — Patient Instructions (Signed)
Good to see you. Hang in there. Drink plenty of fluids.  Start Mucinex DM, ok to take hycodan at bedtime.  Take zpack as directed.

## 2014-03-30 NOTE — Progress Notes (Signed)
Pre visit review using our clinic review tool, if applicable. No additional management support is needed unless otherwise documented below in the visit note. 

## 2014-04-14 ENCOUNTER — Ambulatory Visit (INDEPENDENT_AMBULATORY_CARE_PROVIDER_SITE_OTHER): Payer: Medicare Other | Admitting: Family Medicine

## 2014-04-14 ENCOUNTER — Encounter: Payer: Self-pay | Admitting: Family Medicine

## 2014-04-14 VITALS — BP 128/70 | HR 65 | Temp 97.7°F | Wt 210.8 lb

## 2014-04-14 DIAGNOSIS — J209 Acute bronchitis, unspecified: Secondary | ICD-10-CM

## 2014-04-14 MED ORDER — HYDROCODONE-HOMATROPINE 5-1.5 MG PO TABS: ORAL_TABLET | ORAL | Status: DC

## 2014-04-14 MED ORDER — HYDROCODONE-HOMATROPINE 5-1.5 MG/5ML PO SYRP: 5.0000 mL | ORAL_SOLUTION | ORAL | Status: DC | PRN

## 2014-04-14 NOTE — Assessment & Plan Note (Signed)
Resolving. Reassurance provided. She did ask for refill of hycodan- tabs cheaper for her- Rx refilled- 20 tabs, discussed sedation precautions. No further work up or treatment indicated at this time. Call or return to clinic prn if these symptoms worsen or fail to improve as anticipated. The patient indicates understanding of these issues and agrees with the plan.

## 2014-04-14 NOTE — Patient Instructions (Signed)
It was great to see you. Your lungs sound beautiful- go to church!  Enjoy the service.  Take hycodan tabs as needed (mainly at bedtime).

## 2014-04-14 NOTE — Progress Notes (Signed)
Subjective:   Patient ID: Ebony Knox, female    DOB: Feb 25, 1941, 74 y.o.   MRN: [id number]  Ebony Knox is a pleasant 74 y.o. year old female who presents to clinic today with Follow-up  on 04/14/2014  HPI:  Saw her 10 days ago for hospital follow up.  Had been to [facility] on 1/10 for progressive URI symptoms- advised likely viral and only given cough suppressant.  When I saw her, symptoms had progressed and lung exam concerning with new wheezes and rales. Given progression and duration of symptoms, treated for bacterial process with zpack and hycodan.  Feeling much better.  Still having night time cough but improved.  She has more energy.  Wants to get back to church and go walking.  Current Outpatient Prescriptions on File Prior to Visit  Medication Sig Dispense Refill  . amLODipine (NORVASC) 5 MG tablet Take 1 tablet (5 mg total) by mouth daily. 30 tablet 5  . aspirin 81 MG tablet Take 81 mg by mouth daily.      . calcium carbonate (OS-CAL) 600 MG TABS Take 600 mg by mouth daily.    . cholecalciferol (VITAMIN D) 1000 UNITS tablet Take 1,000 Units by mouth daily.    . fluticasone (FLONASE) 50 MCG/ACT nasal spray Place 2 sprays into both nostrils daily. 16 g 6  . Multiple Vitamin (MULTIVITAMIN) capsule Take 1 capsule by mouth daily.      . pravastatin (PRAVACHOL) 40 MG tablet TAKE 1 TABLET (40 MG TOTAL) BY MOUTH AT BEDTIME. 30 tablet 5  . spironolactone-hydrochlorothiazide (ALDACTAZIDE) 25-25 MG per tablet TAKE 1 TABLET BY MOUTH DAILY. 30 tablet 5  . triamcinolone cream (KENALOG) 0.1 % Apply 1 application topically 2 (two) times daily. 30 g 0  . vitamin B-12 (CYANOCOBALAMIN) 1000 MCG tablet Take 1,000 mcg by mouth daily.       No current facility-administered medications on file prior to visit.    Allergies  Allergen Reactions  . Penicillins     Past Medical History  Diagnosis Date  . Hyperlipidemia   . Hypertension     History reviewed. No pertinent past surgical  history.  Family History  Problem Relation Age of Onset  . Heart disease Mother   . Heart disease Father   . Diabetes Brother   . Cancer Brother     colon  . Colon cancer Neg Hx   . Esophageal cancer Neg Hx   . Rectal cancer Neg Hx   . Stomach cancer Neg Hx     History   Social History  . Marital Status: Divorced    Spouse Name: N/A    Number of Children: 0  . Years of Education: N/A   Occupational History  . Retired [employer]   Social History Main Topics  . Smoking status: Never Smoker   . Smokeless tobacco: Never Used  . Alcohol Use: No  . Drug Use: No  . Sexual Activity: Not on file   Other Topics Concern  . Not on file   Social History Narrative   Does not have a living will.   Desires CPR.  Would accept life support but not for prolonged period of time.   The PMH, PSH, Social History, Family History, Medications, and allergies have been reviewed in [facility], and have been updated if relevant.   Review of Systems  Constitutional: Negative.   Respiratory: Positive for cough. Negative for chest tightness, shortness of breath, wheezing and stridor.   Musculoskeletal: Negative.  Skin: Negative.   Hematological: Negative.   Psychiatric/Behavioral: Negative.   All other systems reviewed and are negative.      Objective:    BP 128/70 mmHg  Pulse 65  Temp(Src) 97.7 F (36.5 C) (Oral)  Wt 210 lb 12 oz (95.596 kg)  SpO2 97%   Physical Exam  Constitutional: She is oriented to person, place, and time. She appears well-developed and well-nourished. No distress.  HENT:  Head: Normocephalic.  Eyes: Conjunctivae are normal.  Neck: Normal range of motion.  Cardiovascular: Normal rate and regular rhythm.   Pulmonary/Chest: Effort normal and breath sounds normal.  Abdominal: Soft.  Neurological: She is alert and oriented to person, place, and time. No cranial nerve deficit.  Skin: Skin is warm and dry.  Psychiatric: She has a normal mood and affect.  Her behavior is normal. Judgment and thought content normal.  Nursing note and vitals reviewed.         Assessment & Plan:   Acute bronchitis, unspecified organism No Follow-up on file.

## 2014-04-14 NOTE — Progress Notes (Signed)
Pre visit review using our clinic review tool, if applicable. No additional management support is needed unless otherwise documented below in the visit note. 

## 2014-04-14 NOTE — Addendum Note (Signed)
Addended by: Lucille Passy on: 04/14/2014 01:16 PM   Modules accepted: Orders, Medications

## 2014-05-07 ENCOUNTER — Other Ambulatory Visit: Payer: Self-pay | Admitting: Family Medicine

## 2014-06-21 ENCOUNTER — Other Ambulatory Visit: Payer: Self-pay | Admitting: Family Medicine

## 2014-07-06 ENCOUNTER — Other Ambulatory Visit: Payer: Self-pay | Admitting: Family Medicine

## 2014-09-13 ENCOUNTER — Other Ambulatory Visit: Payer: Self-pay | Admitting: Family Medicine

## 2014-10-05 ENCOUNTER — Other Ambulatory Visit: Payer: Self-pay | Admitting: Family Medicine

## 2014-10-29 ENCOUNTER — Other Ambulatory Visit: Payer: Self-pay | Admitting: Family Medicine

## 2014-11-10 ENCOUNTER — Encounter: Payer: Self-pay | Admitting: Family Medicine

## 2014-11-10 ENCOUNTER — Ambulatory Visit (INDEPENDENT_AMBULATORY_CARE_PROVIDER_SITE_OTHER): Payer: Medicare Other | Admitting: Family Medicine

## 2014-11-10 VITALS — BP 114/66 | HR 61 | Temp 97.6°F | Ht 68.5 in | Wt 211.5 lb

## 2014-11-10 DIAGNOSIS — E7800 Pure hypercholesterolemia, unspecified: Secondary | ICD-10-CM

## 2014-11-10 DIAGNOSIS — I10 Essential (primary) hypertension: Secondary | ICD-10-CM

## 2014-11-10 DIAGNOSIS — Z0000 Encounter for general adult medical examination without abnormal findings: Secondary | ICD-10-CM | POA: Diagnosis not present

## 2014-11-10 LAB — COMPREHENSIVE METABOLIC PANEL
ALT: 14 U/L (ref 0–35)
AST: 18 U/L (ref 0–37)
Albumin: 4 g/dL (ref 3.5–5.2)
Alkaline Phosphatase: 64 U/L (ref 39–117)
BILIRUBIN TOTAL: 0.4 mg/dL (ref 0.2–1.2)
BUN: 9 mg/dL (ref 6–23)
CALCIUM: 9.7 mg/dL (ref 8.4–10.5)
CHLORIDE: 100 meq/L (ref 96–112)
CO2: 33 mEq/L — ABNORMAL HIGH (ref 19–32)
Creatinine, Ser: 0.82 mg/dL (ref 0.40–1.20)
GFR: 87.67 mL/min (ref >60.00)
Glucose, Bld: 95 mg/dL (ref 70–99)
Potassium: 3.4 mEq/L — ABNORMAL LOW (ref 3.5–5.1)
Sodium: 138 mEq/L (ref 135–145)
Total Protein: 7.4 g/dL (ref 6.0–8.3)

## 2014-11-10 LAB — CBC WITH DIFFERENTIAL/PLATELET
Basophils Absolute: 0 10*3/uL (ref 0.0–0.1)
Basophils Relative: 0.5 % (ref 0.0–3.0)
Eosinophils Absolute: 0.1 10*3/uL (ref 0.0–0.7)
Eosinophils Relative: 1.5 % (ref 0.0–5.0)
HEMATOCRIT: 38.5 % (ref 36.0–46.0)
Hemoglobin: 12.8 g/dL (ref 12.0–15.0)
Lymphocytes Relative: 25.7 % (ref 12.0–46.0)
Lymphs Abs: 2.2 10*3/uL (ref 0.7–4.0)
MCHC: 33.1 g/dL (ref 30.0–36.0)
MCV: 93.7 fl (ref 78.0–100.0)
MONOS PCT: 6.8 % (ref 3.0–12.0)
Monocytes Absolute: 0.6 10*3/uL (ref 0.1–1.0)
NEUTROS ABS: 5.6 10*3/uL (ref 1.4–7.7)
Neutrophils Relative %: 65.5 % (ref 43.0–77.0)
PLATELETS: 251 10*3/uL (ref 150.0–400.0)
RBC: 4.11 Mil/uL (ref 3.87–5.11)
RDW: 13.2 % (ref 11.5–15.5)
WBC: 8.5 10*3/uL (ref 4.0–10.5)

## 2014-11-10 LAB — LIPID PANEL
CHOL/HDL RATIO: 4
CHOLESTEROL: 205 mg/dL — AB (ref 0–200)
HDL: 50 mg/dL (ref >39.00)
LDL Cholesterol: 135 mg/dL — ABNORMAL HIGH (ref 0–99)
NonHDL: 154.71
TRIGLYCERIDES: 97 mg/dL (ref 0.0–149.0)
VLDL: 19.4 mg/dL (ref 0.0–40.0)

## 2014-11-10 LAB — TSH: TSH: 1.9 u[IU]/mL (ref 0.35–4.50)

## 2014-11-10 MED ORDER — PRAVASTATIN SODIUM 40 MG PO TABS: ORAL_TABLET | ORAL | Status: DC

## 2014-11-10 NOTE — Patient Instructions (Addendum)
Great to see you. Try Zyrtec for you allergies.  We will call you with your lab results and you can view them online.

## 2014-11-10 NOTE — Assessment & Plan Note (Signed)
Well controlled.  No changes made. 

## 2014-11-10 NOTE — Progress Notes (Signed)
Ebony Knox is a very pleasant 74 year old female with h/o HTN, OA,  and HLD here for annual medicare wellness visit and follow up of chronic medical conditions.  I have personally reviewed the Medicare Annual Wellness questionnaire and have noted 1. The patient's medical and social history 2. Their use of alcohol, tobacco or illicit drugs 3. Their current medications and supplements 4. The patient's functional ability including ADL's, fall risks, home safety risks and hearing or visual             impairment. 5. Diet and physical activities 6. Evidence for depression or mood disorders  Td 07/18/06 Has previously refused pneumovax, influenza, and zoster vaccinations. Colonoscopy 03/30/13 ([facility])- 10 year recall Mammogram 01/04/13  LMP over 20 years ago.  No h/o post menopausal bleeding.  End of life wishes discussed and updated in Social History.  The roster of all physicians providing medical care to patient - is listed in the Snapshot section of the chart.  Doing very well- just put her house back on the market.  Wants to move back to [place] to be closer to her 5 grand children and 11 great grand children.   Hyperlipidemia- due for labs.   Still taking pravachol 40 mg nightly.   Denies myalgias. Lab Results  Component Value Date   CHOL 206* 11/08/2013   HDL 54.40 11/08/2013   LDLCALC 136* 11/08/2013   LDLDIRECT 142.4 02/05/2013   TRIG 76.0 11/08/2013   CHOLHDL 4 11/08/2013      Wt Readings from Last 3 Encounters:  11/10/14 211 lb 8 oz (95.936 kg)  04/14/14 210 lb 12 oz (95.596 kg)  03/30/14 212 lb 4 oz (96.276 kg)     HTN- well controlled with Aldactazide 25-25 and Amlodipine 5 mg.  Still walks regularly.  No CP.HA or visual changes.  Lab Results  Component Value Date   CREATININE 0.9 11/08/2013      Patient Active Problem List   Diagnosis Date Noted  . Encounter for Medicare annual wellness exam 11/05/2012  . Pure hypercholesterolemia 05/15/2006  .  HYPERTENSION, BENIGN SYSTEMIC 05/15/2006    Social History  Substance Use Topics  . Smoking status: Never Smoker   . Smokeless tobacco: Never Used  . Alcohol Use: No   Family History  Problem Relation Age of Onset  . Heart disease Mother   . Heart disease Father   . Diabetes Brother   . Cancer Brother     colon  . Colon cancer Neg Hx   . Esophageal cancer Neg Hx   . Rectal cancer Neg Hx   . Stomach cancer Neg Hx    Allergies  Allergen Reactions  . Penicillins    Current Outpatient Prescriptions on File Prior to Visit  Medication Sig Dispense Refill  . amLODipine (NORVASC) 5 MG tablet TAKE 1 TABLET (5 MG TOTAL) BY MOUTH DAILY. 30 tablet 5  . aspirin 81 MG tablet Take 81 mg by mouth daily.      . calcium carbonate (OS-CAL) 600 MG TABS Take 600 mg by mouth daily.    . cholecalciferol (VITAMIN D) 1000 UNITS tablet Take 1,000 Units by mouth daily.    . fluticasone (FLONASE) 50 MCG/ACT nasal spray Place 2 sprays into both nostrils daily. 16 g 6  . Multiple Vitamin (MULTIVITAMIN) capsule Take 1 capsule by mouth daily.      Marland Kitchen spironolactone-hydrochlorothiazide (ALDACTAZIDE) 25-25 MG per tablet TAKE 1 TABLET BY MOUTH DAILY. 30 tablet 5  . triamcinolone cream (  KENALOG) 0.1 % Apply 1 application topically 2 (two) times daily. 30 g 0  . vitamin B-12 (CYANOCOBALAMIN) 1000 MCG tablet Take 1,000 mcg by mouth daily.       No current facility-administered medications on file prior to visit.   The PMH, PSH, Social History, Family History, Medications, and allergies have been reviewed in [facility], and have been updated if relevant.   Review of Systems  Constitutional: Negative.   HENT: Negative.   Eyes: Negative.   Respiratory: Negative.   Cardiovascular: Negative.   Gastrointestinal: Negative.   Endocrine: Negative.   Genitourinary: Negative.   Musculoskeletal: Negative.   Skin: Negative.   Allergic/Immunologic: Negative.   Neurological: Negative.   Hematological: Negative.    Psychiatric/Behavioral: Negative.   All other systems reviewed and are negative.     Physical Exam BP 114/66 mmHg  Pulse 61  Temp(Src) 97.6 F (36.4 C) (Oral)  Ht 5' 8.5" (1.74 m)  Wt 211 lb 8 oz (95.936 kg)  BMI 31.69 kg/m2  SpO2 94%  Wt Readings from Last 3 Encounters:  11/10/14 211 lb 8 oz (95.936 kg)  04/14/14 210 lb 12 oz (95.596 kg)  03/30/14 212 lb 4 oz (96.276 kg)     General:  Well-developed,well-nourished,in no acute distress; alert,appropriate and cooperative throughout examination Head:  normocephalic and atraumatic.   Eyes:  vision grossly intact, pupils equal, pupils round, and pupils reactive to light.   Ears:  R ear normal and L ear normal.   Nose:  no external deformity.   Mouth:  good dentition.   Neck:  No deformities, masses, or tenderness noted. Breasts:  No mass, nodules, thickening, tenderness, bulging, retraction, inflamation, nipple discharge or skin changes noted.   Lungs:  Normal respiratory effort, chest expands symmetrically. Lungs are clear to auscultation, no crackles or wheezes. Heart:  Normal rate and regular rhythm. S1 and S2 normal without gallop, murmur, click, rub or other extra sounds. Abdomen:  Bowel sounds positive,abdomen soft and non-tender without masses, organomegaly or hernias noted. Msk:  No deformity or scoliosis noted of thoracic or lumbar spine.   Extremities:  No clubbing, cyanosis, edema, or deformity noted with normal full range of motion of all joints.   Neurologic:  alert & oriented X3 and gait normal.   Skin:  Intact without suspicious lesions or rashes Cervical Nodes:  No lymphadenopathy noted Axillary Nodes:  No palpable lymphadenopathy Psych:  Cognition and judgment appear intact. Alert and cooperative with normal attention span and concentration. No apparent delusions, illusions, hallucinations

## 2014-11-10 NOTE — Assessment & Plan Note (Addendum)
The patients weight, height, BMI and visual acuity have been recorded in the chart.  Cognitive function assessed.   I have made referrals, counseling and provided education to the patient based review of the above and I have provided the pt with a written personalized care plan for preventive services.  Orders Placed This Encounter  Procedures  . CBC with Differential/Platelet  . Comprehensive metabolic panel  . Lipid panel  . TSH    

## 2014-11-10 NOTE — Progress Notes (Signed)
Pre visit review using our clinic review tool, if applicable. No additional management support is needed unless otherwise documented below in the visit note. 

## 2014-11-10 NOTE — Assessment & Plan Note (Signed)
Labs today. No changes made. 

## 2014-11-11 ENCOUNTER — Encounter: Payer: Self-pay | Admitting: *Deleted

## 2015-01-10 ENCOUNTER — Telehealth: Payer: Self-pay | Admitting: Family Medicine

## 2015-01-10 NOTE — Telephone Encounter (Signed)
She can schedule a nurse visit on any day but Thursday, as it has to be read within 2-3 business days

## 2015-01-10 NOTE — Telephone Encounter (Signed)
Pt needs tb test for employment please call [phone]. Pt requesting asap as she is waiting on test for employment Thank you

## 2015-01-12 ENCOUNTER — Ambulatory Visit: Payer: Self-pay

## 2015-01-13 ENCOUNTER — Ambulatory Visit (INDEPENDENT_AMBULATORY_CARE_PROVIDER_SITE_OTHER): Payer: Medicare Other | Admitting: *Deleted

## 2015-01-13 DIAGNOSIS — Z111 Encounter for screening for respiratory tuberculosis: Secondary | ICD-10-CM | POA: Diagnosis not present

## 2015-01-16 LAB — TB SKIN TEST
Induration: 0 mm
TB Skin Test: NEGATIVE

## 2015-04-11 ENCOUNTER — Telehealth: Payer: Self-pay | Admitting: *Deleted

## 2015-04-11 NOTE — Telephone Encounter (Signed)
-----   Message from Lucille Passy, MD sent at 04/10/2015  5:31 PM EST ----- Regarding: FW: Medication Adherence follow up Please see below and update her rx as she is taking it. ----- Message -----    From: Warden Fillers, RN    Sent: 04/10/2015   4:49 PM      To: Lucille Passy, MD Subject: Medication Adherence follow up                 Dr. Deborra Medina,  I am working on closing gaps with [facility] medication adherence.  I spoke with this patient about her cholesterol medication and she is taking 1/2 dose daily.  The report is showing that she is filling her medication less than 45 percent of the time.  In order to close the medication gap, the script would need to be re-written to reflect what the patient is actually taking.  Do you have any suggestions?  Thanks! Villa Herb, RN

## 2015-04-11 NOTE — Telephone Encounter (Signed)
Med list updated

## 2015-05-25 DIAGNOSIS — H524 Presbyopia: Secondary | ICD-10-CM | POA: Diagnosis not present

## 2015-07-11 ENCOUNTER — Other Ambulatory Visit: Payer: Self-pay | Admitting: Family Medicine

## 2015-08-02 ENCOUNTER — Encounter: Payer: Self-pay | Admitting: Family Medicine

## 2015-08-10 ENCOUNTER — Telehealth: Payer: Self-pay | Admitting: *Deleted

## 2015-08-10 MED ORDER — PRAVASTATIN SODIUM 20 MG PO TABS: 20.0000 mg | ORAL_TABLET | Freq: Every day | ORAL | Status: DC

## 2015-08-10 NOTE — Telephone Encounter (Signed)
-----   Message from Lucille Passy, MD sent at 08/10/2015  7:09 AM EDT ----- Regarding: FW: Medication adherence to Pravastatin Please see below-call pt and ok to write new rx as requested. 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 Per the med list in EPIC, it looks like she is taking Pravastatin 40 mg 1/2 tablet once a day. (Per the "patient taking differently note")  If she is actually taking 1/2 a tablet a day, could you write a prescription for Pravastatin 20 mg daily which would reflect how she is actually taking it.  If cost is an issue, she can get it through [facility] for a zero copay.  I called her to discuss and she did not wish to speak to me.  She preferred me to discuss with you.  If you have any questions, please feel free to call me.  Thanks  Deanne Coffer, PharmD, Agency [phone]

## 2015-08-16 ENCOUNTER — Telehealth: Payer: Self-pay | Admitting: Family Medicine

## 2015-08-16 MED ORDER — SPIRONOLACTONE-HCTZ 25-25 MG PO TABS: 1.0000 | ORAL_TABLET | Freq: Every day | ORAL | Status: AC

## 2015-08-16 MED ORDER — PRAVASTATIN SODIUM 20 MG PO TABS: 20.0000 mg | ORAL_TABLET | Freq: Every day | ORAL | Status: AC

## 2015-08-16 MED ORDER — AMLODIPINE BESYLATE 5 MG PO TABS: ORAL_TABLET | ORAL | Status: AC

## 2015-08-16 NOTE — Telephone Encounter (Signed)
Pt called wanting dr Deborra Medina or her medical assisant to call her regarding her medicatoin. Offered for her to talk to [facility] pt refused she only wanted to speak to dr Deborra Medina or medical assisant

## 2015-08-16 NOTE — Telephone Encounter (Signed)
Spoke to pt who states she is relocating to [place] and is requesting a 90d supply of medications to last until she is able to find a provider there. Rx sent as requested

## 2015-11-14 ENCOUNTER — Encounter: Payer: Self-pay | Admitting: Family Medicine

## 2016-02-01 IMAGING — CR DG CHEST 2V
2 series · 2 of 2 positions shown · non-contrast
Comparison: April 27, 2006

CLINICAL DATA: Five day history of cough and congestion

EXAM:
CHEST  2 VIEW

[w chest pa]
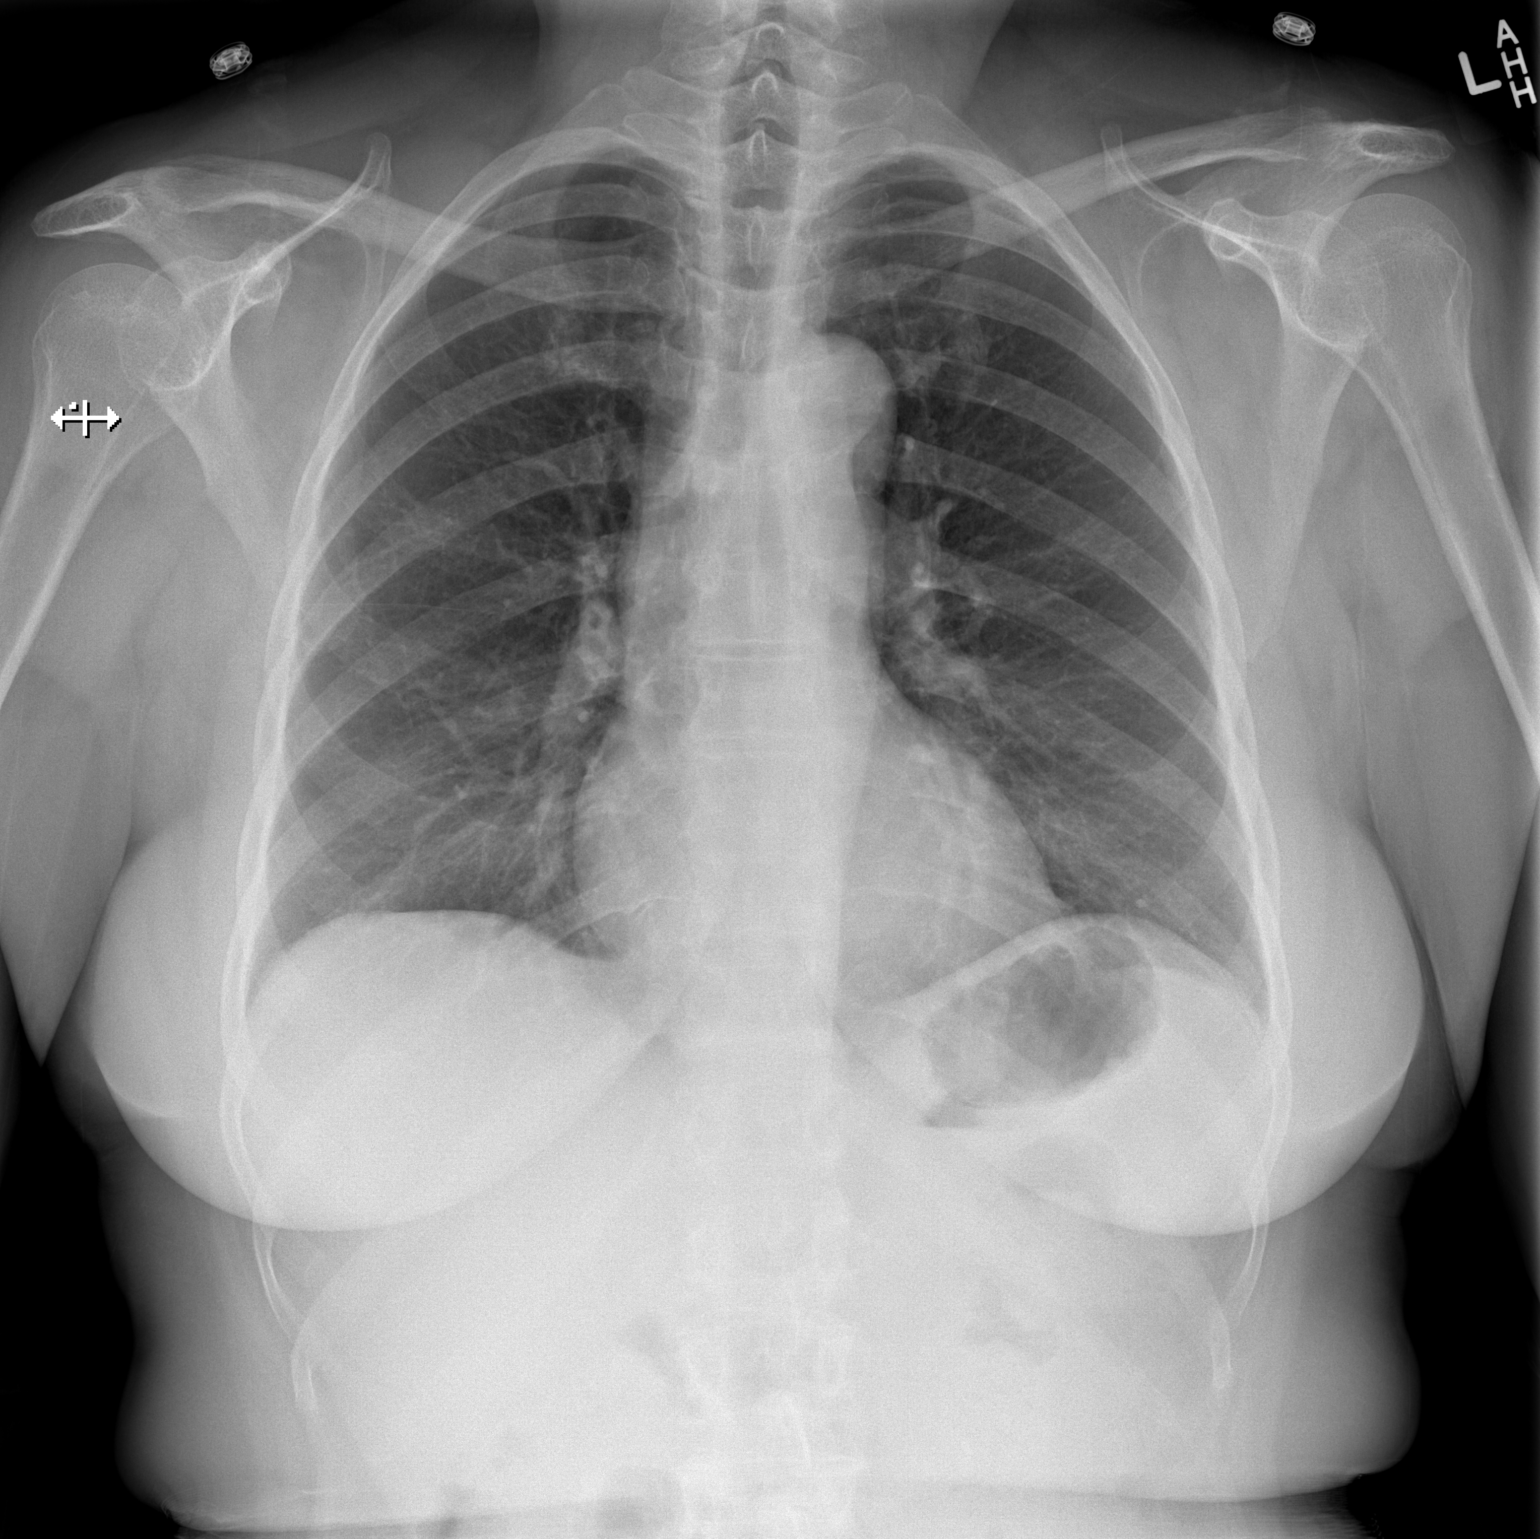

[w chest lat]
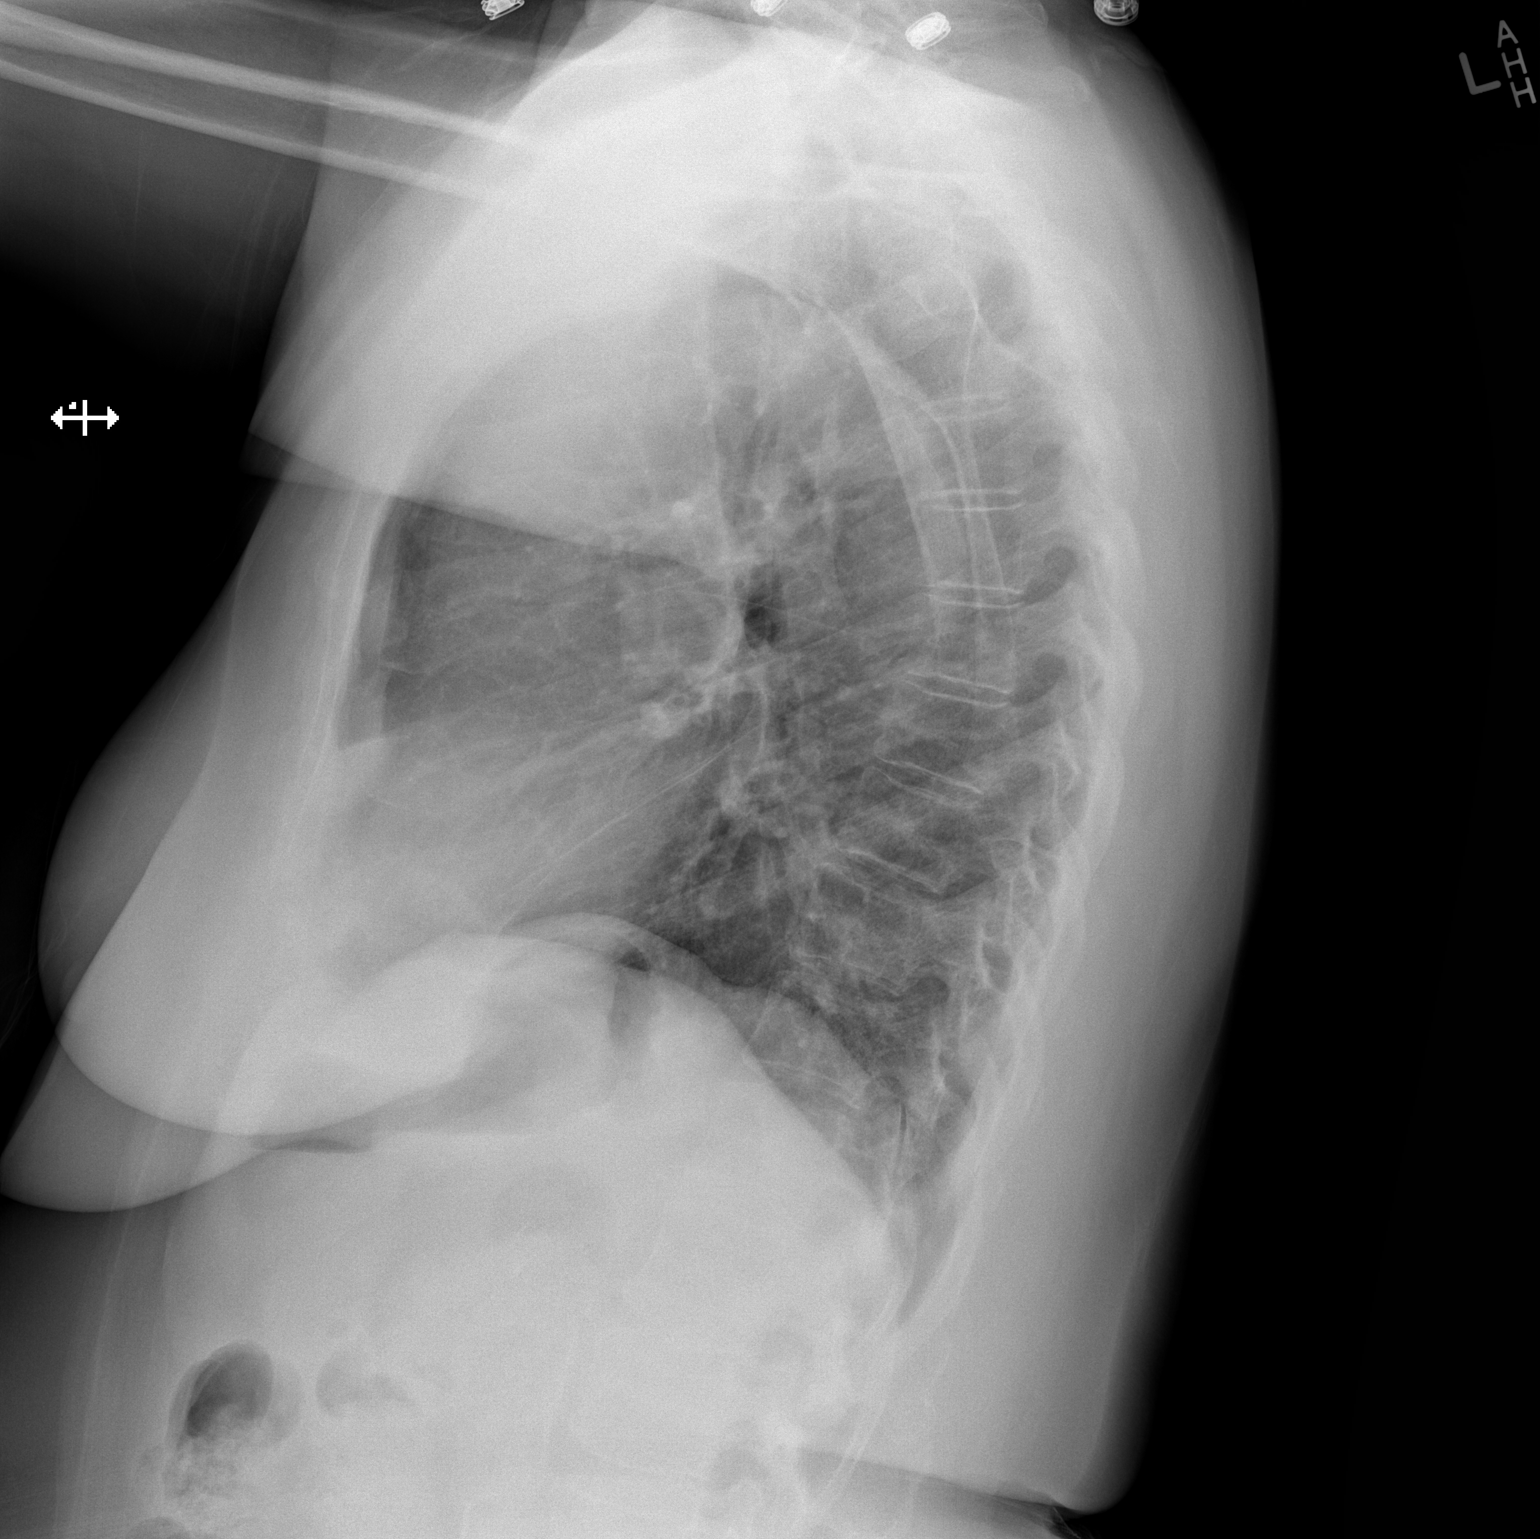

[2 of 2 positions shown; findings below may reference images not displayed]

FINDINGS: There is a tiny granuloma in the left base. Elsewhere lungs are
clear. Heart size and pulmonary vascularity are normal. No
adenopathy. No bone lesions.
IMPRESSION: Tiny granuloma left base.  No edema or consolidation.

## 2018-03-16 ENCOUNTER — Encounter: Payer: Self-pay | Admitting: *Deleted

## 2018-05-15 ENCOUNTER — Encounter: Payer: Self-pay | Admitting: Internal Medicine
